# Patient Record
Sex: Female | Born: 1970 | Race: White | Hispanic: No | Smoking: Former smoker
Health system: Southern US, Community
[De-identification: ages and names within clinical notes are randomized; demographics above are authoritative.]

## PROBLEM LIST (undated history)

## (undated) DIAGNOSIS — R87619 Unspecified abnormal cytological findings in specimens from cervix uteri: Secondary | ICD-10-CM

## (undated) DIAGNOSIS — J189 Pneumonia, unspecified organism: Secondary | ICD-10-CM

## (undated) DIAGNOSIS — J40 Bronchitis, not specified as acute or chronic: Secondary | ICD-10-CM

## (undated) DIAGNOSIS — M1990 Unspecified osteoarthritis, unspecified site: Secondary | ICD-10-CM

## (undated) DIAGNOSIS — N200 Calculus of kidney: Secondary | ICD-10-CM

## (undated) DIAGNOSIS — Z9884 Bariatric surgery status: Secondary | ICD-10-CM

## (undated) DIAGNOSIS — M542 Cervicalgia: Secondary | ICD-10-CM

## (undated) DIAGNOSIS — G8929 Other chronic pain: Secondary | ICD-10-CM

## (undated) DIAGNOSIS — Z86718 Personal history of other venous thrombosis and embolism: Secondary | ICD-10-CM

## (undated) DIAGNOSIS — F329 Major depressive disorder, single episode, unspecified: Secondary | ICD-10-CM

## (undated) DIAGNOSIS — F32A Depression, unspecified: Secondary | ICD-10-CM

## (undated) DIAGNOSIS — F17200 Nicotine dependence, unspecified, uncomplicated: Secondary | ICD-10-CM

## (undated) DIAGNOSIS — D509 Iron deficiency anemia, unspecified: Secondary | ICD-10-CM

## (undated) DIAGNOSIS — F341 Dysthymic disorder: Secondary | ICD-10-CM

## (undated) DIAGNOSIS — M5412 Radiculopathy, cervical region: Secondary | ICD-10-CM

## (undated) DIAGNOSIS — K8020 Calculus of gallbladder without cholecystitis without obstruction: Secondary | ICD-10-CM

## (undated) DIAGNOSIS — M479 Spondylosis, unspecified: Secondary | ICD-10-CM

## (undated) DIAGNOSIS — Z114 Encounter for screening for human immunodeficiency virus [HIV]: Secondary | ICD-10-CM

## (undated) DIAGNOSIS — Z87898 Personal history of other specified conditions: Secondary | ICD-10-CM

## (undated) HISTORY — DX: Bronchitis, not specified as acute or chronic: J40

## (undated) HISTORY — DX: Depression, unspecified: F32.A

## (undated) HISTORY — DX: Dysthymic disorder: F34.1

## (undated) HISTORY — DX: Other chronic pain: G89.29

## (undated) HISTORY — DX: Pneumonia, unspecified organism: J18.9

## (undated) HISTORY — DX: Personal history of other venous thrombosis and embolism: Z86.718

## (undated) HISTORY — PX: OTHER SURGICAL HISTORY: SHX169

## (undated) HISTORY — DX: Encounter for screening for human immunodeficiency virus (HIV): Z11.4

## (undated) HISTORY — DX: Cervicalgia: M54.2

## (undated) HISTORY — DX: Nicotine dependence, unspecified, uncomplicated: F17.200

## (undated) HISTORY — DX: Personal history of other specified conditions: Z87.898

## (undated) HISTORY — DX: Unspecified abnormal cytological findings in specimens from cervix uteri: R87.619

## (undated) HISTORY — DX: Major depressive disorder, single episode, unspecified: F32.9

## (undated) HISTORY — DX: Unspecified osteoarthritis, unspecified site: M19.90

## (undated) HISTORY — DX: Bariatric surgery status: Z98.84

## (undated) HISTORY — DX: Radiculopathy, cervical region: M54.12

## (undated) HISTORY — DX: Iron deficiency anemia, unspecified: D50.9

## (undated) HISTORY — DX: Calculus of kidney: N20.0

## (undated) HISTORY — DX: Spondylosis, unspecified: M47.9

## (undated) HISTORY — DX: Calculus of gallbladder without cholecystitis without obstruction: K80.20

---

## 1998-08-10 ENCOUNTER — Emergency Department (HOSPITAL_COMMUNITY): Admission: EM | Admit: 1998-08-10 | Discharge: 1998-08-10 | Payer: Self-pay | Admitting: Emergency Medicine

## 1998-08-10 ENCOUNTER — Encounter: Payer: Self-pay | Admitting: Emergency Medicine

## 2004-06-10 HISTORY — PX: GASTRIC BYPASS: SHX52

## 2005-03-07 ENCOUNTER — Emergency Department (HOSPITAL_COMMUNITY): Admission: EM | Admit: 2005-03-07 | Discharge: 2005-03-07 | Payer: Self-pay | Admitting: Emergency Medicine

## 2005-06-10 ENCOUNTER — Encounter: Payer: Self-pay | Admitting: Family Medicine

## 2005-06-10 LAB — CONVERTED CEMR LAB: Pap Smear: NORMAL

## 2005-07-08 ENCOUNTER — Other Ambulatory Visit: Admission: RE | Admit: 2005-07-08 | Discharge: 2005-07-08 | Payer: Self-pay | Admitting: Obstetrics and Gynecology

## 2005-10-17 ENCOUNTER — Encounter: Payer: Self-pay | Admitting: Family Medicine

## 2007-01-07 ENCOUNTER — Emergency Department (HOSPITAL_COMMUNITY): Admission: EM | Admit: 2007-01-07 | Discharge: 2007-01-08 | Payer: Self-pay | Admitting: Emergency Medicine

## 2007-01-10 ENCOUNTER — Emergency Department (HOSPITAL_COMMUNITY): Admission: EM | Admit: 2007-01-10 | Discharge: 2007-01-10 | Payer: Self-pay | Admitting: Emergency Medicine

## 2007-05-09 ENCOUNTER — Emergency Department (HOSPITAL_COMMUNITY): Admission: EM | Admit: 2007-05-09 | Discharge: 2007-05-09 | Payer: Self-pay | Admitting: Emergency Medicine

## 2007-06-11 DIAGNOSIS — J189 Pneumonia, unspecified organism: Secondary | ICD-10-CM

## 2007-06-11 HISTORY — DX: Pneumonia, unspecified organism: J18.9

## 2007-07-07 ENCOUNTER — Emergency Department (HOSPITAL_COMMUNITY): Admission: EM | Admit: 2007-07-07 | Discharge: 2007-07-07 | Payer: Self-pay | Admitting: Family Medicine

## 2007-08-02 ENCOUNTER — Emergency Department (HOSPITAL_COMMUNITY): Admission: EM | Admit: 2007-08-02 | Discharge: 2007-08-02 | Payer: Self-pay | Admitting: Family Medicine

## 2008-02-16 ENCOUNTER — Ambulatory Visit: Payer: Self-pay | Admitting: Family Medicine

## 2008-02-16 DIAGNOSIS — Z9884 Bariatric surgery status: Secondary | ICD-10-CM

## 2008-02-16 DIAGNOSIS — M479 Spondylosis, unspecified: Secondary | ICD-10-CM | POA: Insufficient documentation

## 2008-02-16 DIAGNOSIS — F329 Major depressive disorder, single episode, unspecified: Secondary | ICD-10-CM | POA: Insufficient documentation

## 2008-02-16 HISTORY — DX: Bariatric surgery status: Z98.84

## 2008-02-16 HISTORY — DX: Spondylosis, unspecified: M47.9

## 2008-03-08 ENCOUNTER — Encounter: Payer: Self-pay | Admitting: Family Medicine

## 2008-03-15 ENCOUNTER — Encounter: Payer: Self-pay | Admitting: Family Medicine

## 2008-03-15 ENCOUNTER — Other Ambulatory Visit: Admission: RE | Admit: 2008-03-15 | Discharge: 2008-03-15 | Payer: Self-pay | Admitting: Family Medicine

## 2008-03-15 ENCOUNTER — Ambulatory Visit: Payer: Self-pay | Admitting: Family Medicine

## 2008-03-15 DIAGNOSIS — Z87898 Personal history of other specified conditions: Secondary | ICD-10-CM

## 2008-03-15 HISTORY — DX: Personal history of other specified conditions: Z87.898

## 2008-03-15 LAB — CONVERTED CEMR LAB
ALT: 13 units/L (ref 0–35)
AST: 17 units/L (ref 0–37)
Albumin: 4.2 g/dL (ref 3.5–5.2)
Alkaline Phosphatase: 102 units/L (ref 39–117)
BUN: 8 mg/dL (ref 6–23)
Basophils Absolute: 0 10*3/uL (ref 0.0–0.1)
Basophils Relative: 0 % (ref 0–1)
Bilirubin Urine: NEGATIVE
Bilirubin, Direct: 0.1 mg/dL (ref 0.0–0.3)
CO2: 21 meq/L (ref 19–32)
Calcium: 9.3 mg/dL (ref 8.4–10.5)
Chloride: 107 meq/L (ref 96–112)
Cholesterol: 189 mg/dL (ref 0–200)
Creatinine, Ser: 0.58 mg/dL (ref 0.40–1.20)
Eosinophils Absolute: 0.3 10*3/uL (ref 0.0–0.7)
Eosinophils Relative: 2 % (ref 0–5)
Glucose, Bld: 87 mg/dL (ref 70–99)
HCT: 39.4 % (ref 36.0–46.0)
HDL: 44 mg/dL (ref >39)
Hemoglobin, Urine: NEGATIVE
Hemoglobin: 12.5 g/dL (ref 12.0–15.0)
Indirect Bilirubin: 0.5 mg/dL (ref 0.0–0.9)
Ketones, ur: NEGATIVE mg/dL
LDL Cholesterol: 129 mg/dL — ABNORMAL HIGH (ref 0–99)
Lymphocytes Relative: 23 % (ref 12–46)
Lymphs Abs: 2.6 10*3/uL (ref 0.7–4.0)
MCHC: 31.7 g/dL (ref 30.0–36.0)
MCV: 59.9 fL — ABNORMAL LOW (ref 78.0–100.0)
Monocytes Absolute: 0.7 10*3/uL (ref 0.1–1.0)
Monocytes Relative: 7 % (ref 3–12)
Neutro Abs: 7.5 10*3/uL (ref 1.7–7.7)
Neutrophils Relative %: 68 % (ref 43–77)
Nitrite: NEGATIVE
Platelets: 271 10*3/uL (ref 150–400)
Potassium: 4.9 meq/L (ref 3.5–5.3)
Protein, ur: NEGATIVE mg/dL
RBC / HPF: NONE SEEN (ref <3)
RBC: 6.58 M/uL — ABNORMAL HIGH (ref 3.87–5.11)
RDW: 15.6 % — ABNORMAL HIGH (ref 11.5–15.5)
Sodium: 138 meq/L (ref 135–145)
Specific Gravity, Urine: 1.018 (ref 1.005–1.03)
TSH: 2.248 microintl units/mL (ref 0.350–4.50)
Total Bilirubin: 0.6 mg/dL (ref 0.3–1.2)
Total CHOL/HDL Ratio: 4.3
Total Protein: 6.7 g/dL (ref 6.0–8.3)
Triglycerides: 82 mg/dL (ref <150)
Urine Glucose: NEGATIVE mg/dL
Urobilinogen, UA: 1 (ref 0.0–1.0)
VLDL: 16 mg/dL (ref 0–40)
Vit D, 1,25-Dihydroxy: 17 — ABNORMAL LOW (ref 30–89)
WBC: 11.2 10*3/uL — ABNORMAL HIGH (ref 4.0–10.5)
pH: 7.5 (ref 5.0–8.0)

## 2008-03-17 ENCOUNTER — Encounter: Payer: Self-pay | Admitting: Family Medicine

## 2008-04-21 ENCOUNTER — Telehealth: Payer: Self-pay | Admitting: Family Medicine

## 2008-04-22 ENCOUNTER — Ambulatory Visit: Payer: Self-pay | Admitting: Family Medicine

## 2008-04-23 ENCOUNTER — Telehealth: Payer: Self-pay | Admitting: Family Medicine

## 2008-05-31 ENCOUNTER — Ambulatory Visit: Payer: Self-pay | Admitting: Family Medicine

## 2008-05-31 DIAGNOSIS — F341 Dysthymic disorder: Secondary | ICD-10-CM

## 2008-05-31 HISTORY — DX: Dysthymic disorder: F34.1

## 2008-07-19 ENCOUNTER — Ambulatory Visit: Payer: Self-pay | Admitting: Family Medicine

## 2008-07-20 ENCOUNTER — Telehealth: Payer: Self-pay | Admitting: Family Medicine

## 2008-08-23 ENCOUNTER — Ambulatory Visit: Payer: Self-pay | Admitting: Family Medicine

## 2008-08-30 ENCOUNTER — Telehealth: Payer: Self-pay | Admitting: Family Medicine

## 2008-09-15 ENCOUNTER — Telehealth: Payer: Self-pay | Admitting: Family Medicine

## 2008-09-27 ENCOUNTER — Ambulatory Visit: Payer: Self-pay | Admitting: Family Medicine

## 2008-09-27 DIAGNOSIS — G4700 Insomnia, unspecified: Secondary | ICD-10-CM

## 2008-10-25 ENCOUNTER — Ambulatory Visit: Payer: Self-pay | Admitting: Family Medicine

## 2008-10-25 DIAGNOSIS — M5412 Radiculopathy, cervical region: Secondary | ICD-10-CM | POA: Insufficient documentation

## 2008-10-25 HISTORY — DX: Radiculopathy, cervical region: M54.12

## 2008-11-10 ENCOUNTER — Telehealth: Payer: Self-pay | Admitting: Family Medicine

## 2008-11-13 ENCOUNTER — Emergency Department (HOSPITAL_COMMUNITY): Admission: EM | Admit: 2008-11-13 | Discharge: 2008-11-13 | Payer: Self-pay | Admitting: Emergency Medicine

## 2008-11-16 ENCOUNTER — Ambulatory Visit: Payer: Self-pay | Admitting: Family Medicine

## 2008-11-27 IMAGING — CR DG KNEE COMPLETE 4+V*L*
4 series · 4 of 4 positions shown · non-contrast
Comparison: None.

CLINICAL DATA: 36 year old with left knee pain.
 LEFT KNEE ? 4 VIEW ? 08/02/07:

[view not recorded (1 of 4)]
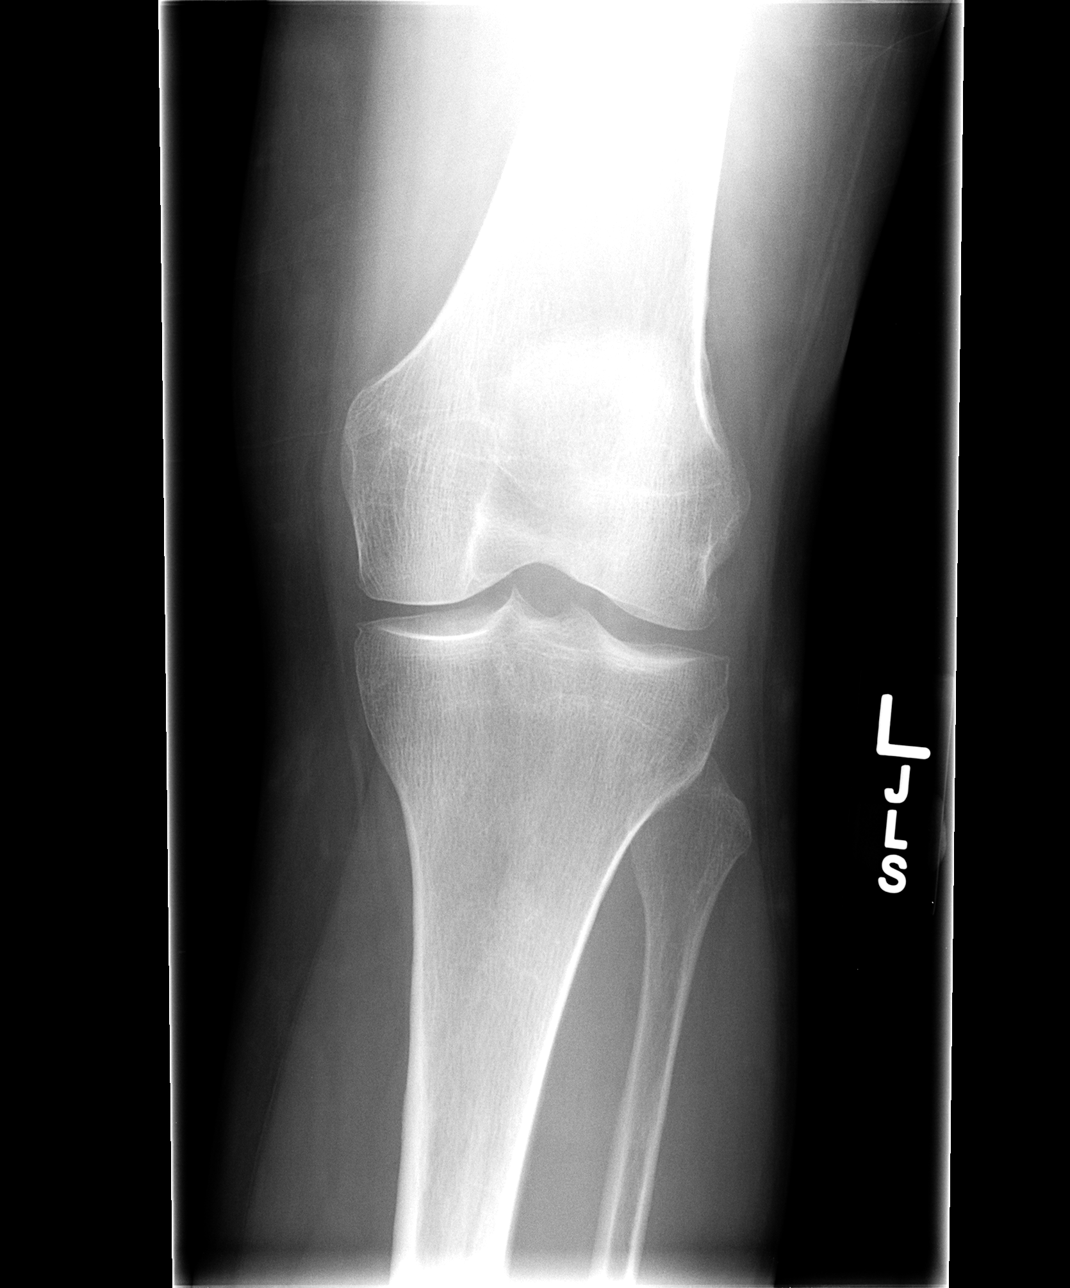

[view not recorded (2 of 4)]
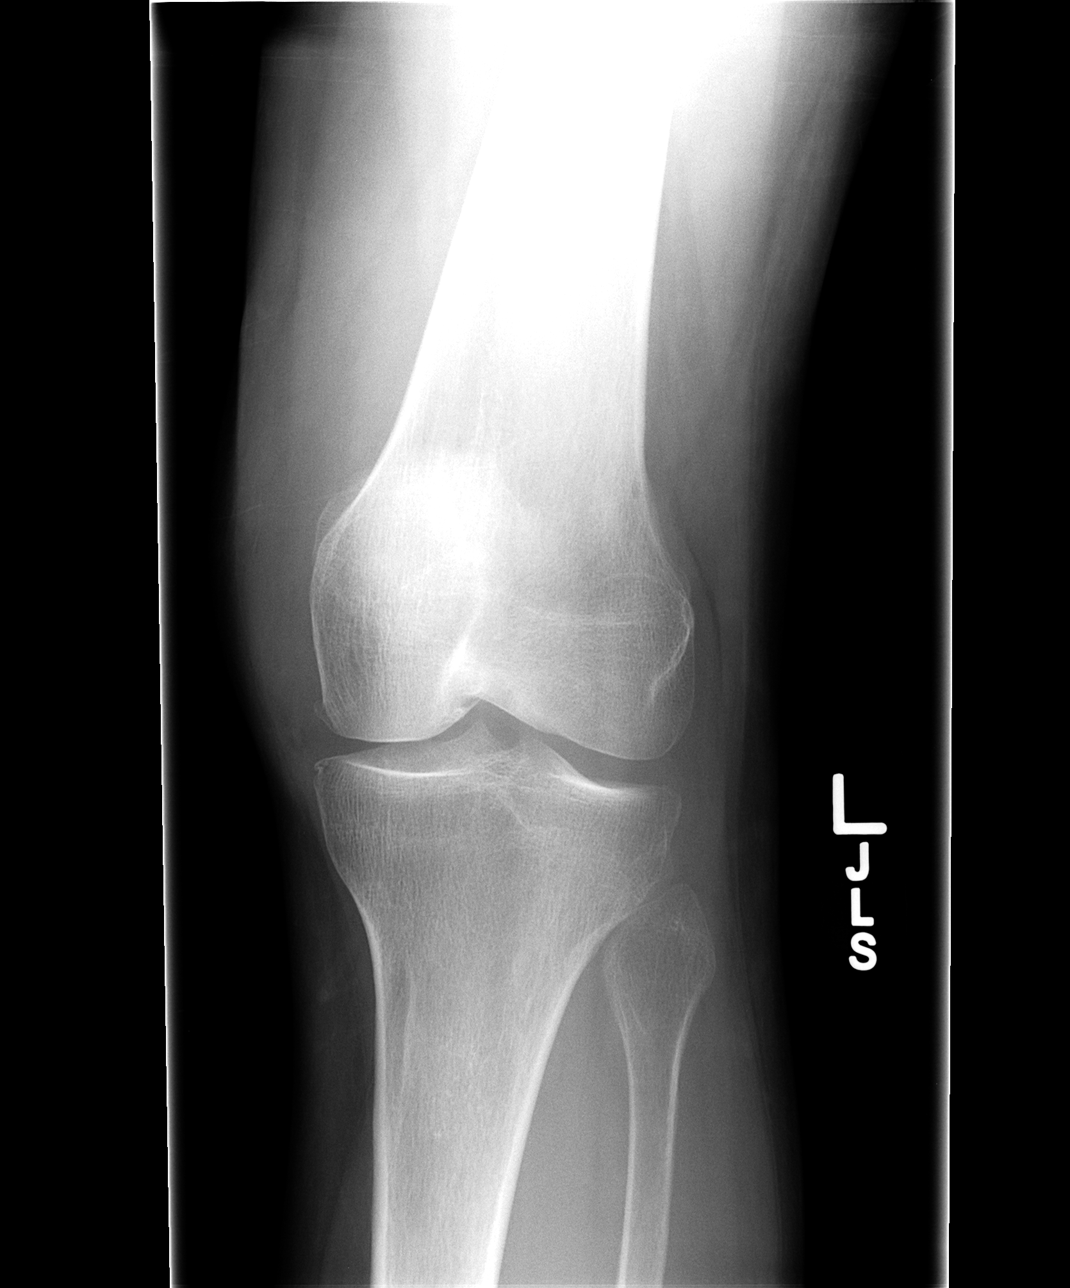

[view not recorded (3 of 4)]
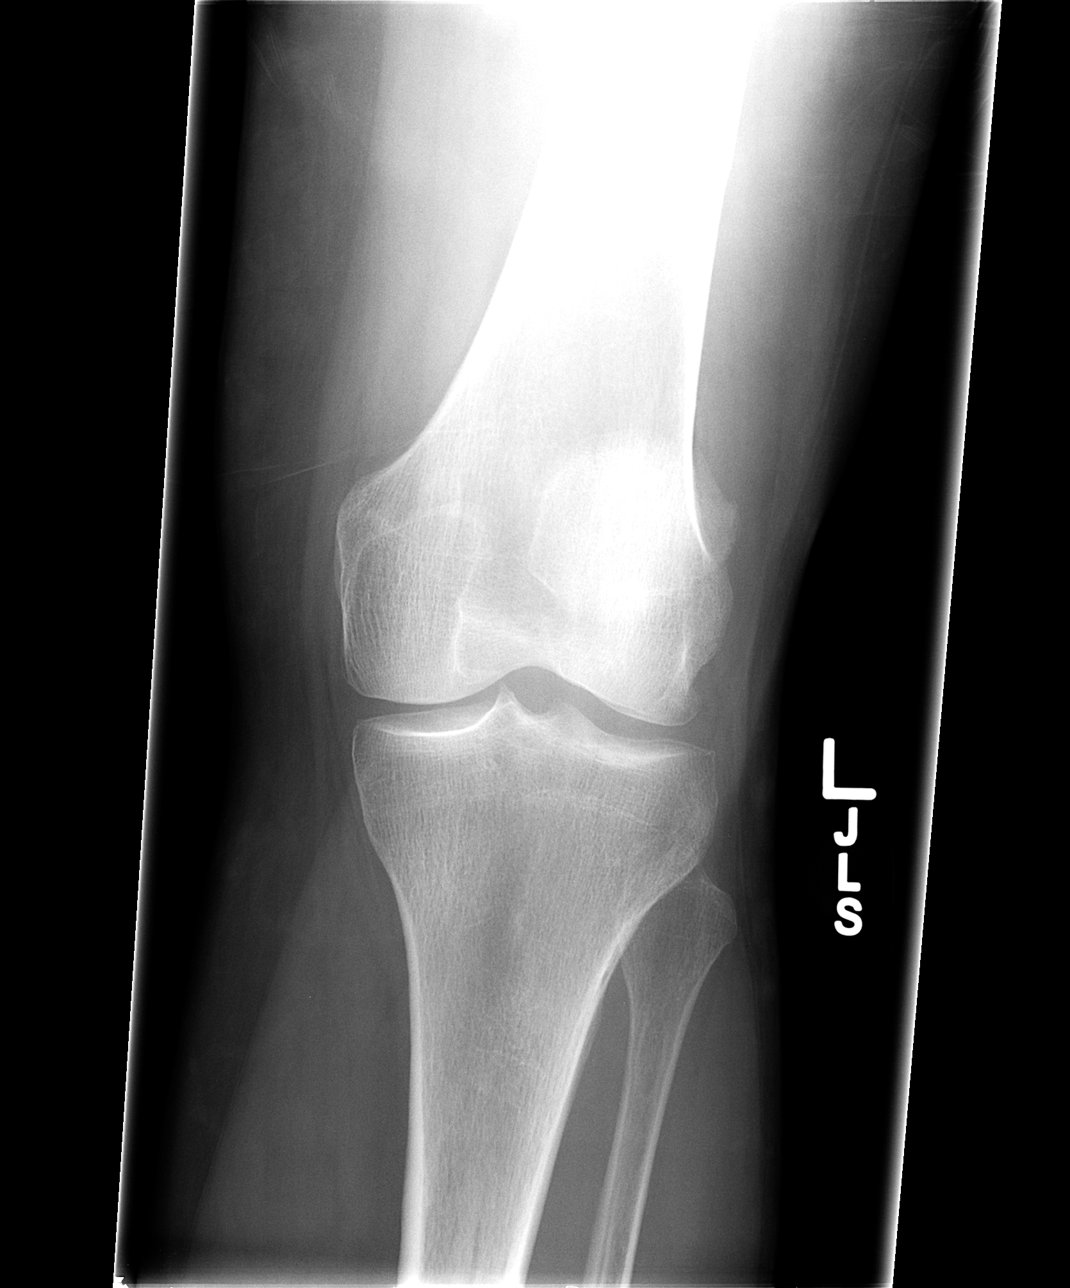

[view not recorded (4 of 4)]
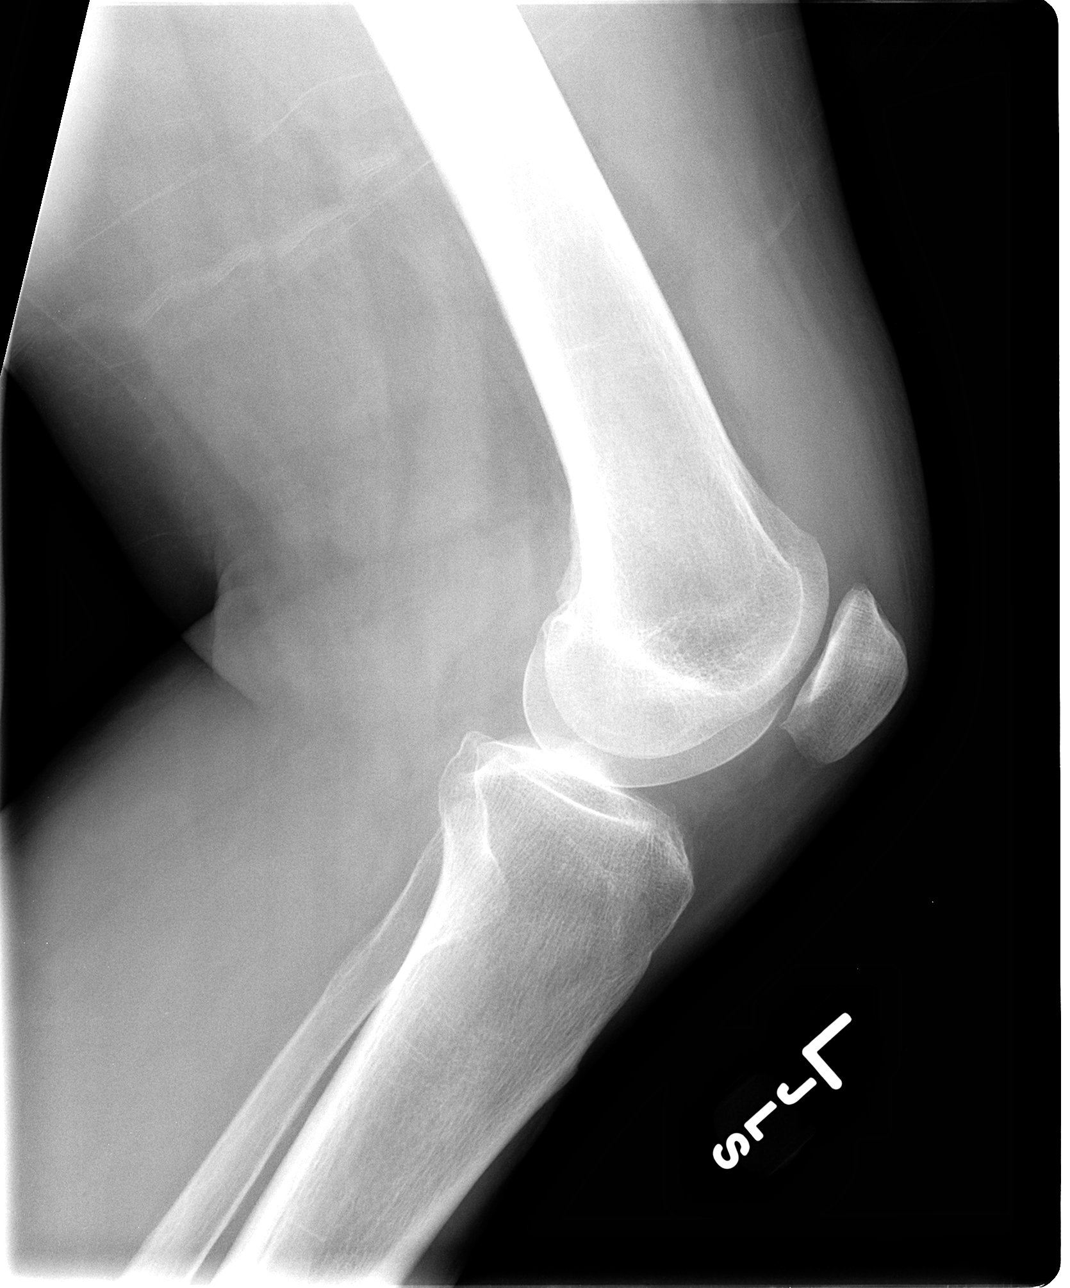

[4 of 4 positions shown; findings below may reference images not displayed]

FINDINGS: There are mild degenerative changes but no acute bony findings.  There is a moderate sized joint effusion.
IMPRESSION: 1.  Minimal degenerative changes.  No acute bony findings.
 2.  Moderate sized joint effusion.

## 2008-11-30 ENCOUNTER — Telehealth: Payer: Self-pay | Admitting: Family Medicine

## 2008-12-08 ENCOUNTER — Ambulatory Visit: Payer: Self-pay | Admitting: Family Medicine

## 2009-01-03 ENCOUNTER — Encounter: Payer: Self-pay | Admitting: Family Medicine

## 2009-02-08 ENCOUNTER — Ambulatory Visit: Payer: Self-pay | Admitting: Family Medicine

## 2009-02-08 DIAGNOSIS — M542 Cervicalgia: Secondary | ICD-10-CM

## 2009-02-08 DIAGNOSIS — M940 Chondrocostal junction syndrome [Tietze]: Secondary | ICD-10-CM

## 2009-02-08 HISTORY — DX: Cervicalgia: M54.2

## 2009-06-21 ENCOUNTER — Ambulatory Visit: Payer: Self-pay | Admitting: Family Medicine

## 2009-10-03 ENCOUNTER — Telehealth: Payer: Self-pay | Admitting: Family Medicine

## 2009-10-24 ENCOUNTER — Encounter: Payer: Self-pay | Admitting: Family Medicine

## 2010-01-30 ENCOUNTER — Ambulatory Visit: Payer: Self-pay | Admitting: Family Medicine

## 2010-01-30 DIAGNOSIS — F17200 Nicotine dependence, unspecified, uncomplicated: Secondary | ICD-10-CM

## 2010-01-30 HISTORY — DX: Nicotine dependence, unspecified, uncomplicated: F17.200

## 2010-02-20 ENCOUNTER — Encounter: Payer: Self-pay | Admitting: Family Medicine

## 2010-05-22 ENCOUNTER — Ambulatory Visit: Payer: Self-pay | Admitting: Family Medicine

## 2010-05-22 DIAGNOSIS — R87619 Unspecified abnormal cytological findings in specimens from cervix uteri: Secondary | ICD-10-CM

## 2010-05-22 DIAGNOSIS — N920 Excessive and frequent menstruation with regular cycle: Secondary | ICD-10-CM | POA: Insufficient documentation

## 2010-05-22 HISTORY — DX: Unspecified abnormal cytological findings in specimens from cervix uteri: R87.619

## 2010-06-05 ENCOUNTER — Ambulatory Visit: Payer: Self-pay | Admitting: Family Medicine

## 2010-06-05 ENCOUNTER — Other Ambulatory Visit
Admission: RE | Admit: 2010-06-05 | Discharge: 2010-06-05 | Payer: Self-pay | Source: Home / Self Care | Admitting: Family Medicine

## 2010-06-05 DIAGNOSIS — N760 Acute vaginitis: Secondary | ICD-10-CM | POA: Insufficient documentation

## 2010-07-03 ENCOUNTER — Telehealth: Payer: Self-pay | Admitting: Family Medicine

## 2010-07-10 NOTE — Progress Notes (Signed)
Summary: Pt has moved to [place]. Needs referral.  Phone Note Call from Patient Call back at [phone] cell   Caller: Patient Summary of Call: Pt called and said that she has moved to [place], [place]. Pt has found a doctor there, but she needs a referral in order to make an ov. Please call [facility]  [phone], and they will send records req and referral form.        Initial call taken by: Lucy Antigua,  October 03, 2009 8:25 AM  Follow-up for Phone Call        pt called again today really wants referral  Follow-up by: Pura Spice, RN,  Oct 17, 2009 10:25 AM  Additional Follow-up for Phone Call Additional follow up Details #1::        left mess for pt to return call  Additional Follow-up by: Pura Spice, RN,  Oct 17, 2009 1:12 PM    Additional Follow-up for Phone Call Additional follow up Details #2::    ok per dr Scotty Court to call and sch appt [facility]  Follow-up by: Pura Spice, RN,  Oct 17, 2009 2:23 PM
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
## 2010-07-10 NOTE — Letter (Signed)
Summary: [facility]  [facility]   Imported By: Maryln Gottron 11/17/2009 13:29:09  _____________________________________________________________________  External Attachment:    Type:   Image     Comment:   External Document  Appended Document: [facility] reviewed denial to pain clinic

## 2010-07-10 NOTE — Assessment & Plan Note (Signed)
Summary: pts had a cold for 3 weeks/cjr   Vital Signs:  Patient profile:   40 year old female Weight:      159 pounds BMI:     26.55 O2 Sat:      98 % Temp:     98.1 degrees F Pulse rate:   112 / minute BP sitting:   110 / 80  (left arm)  Vitals Entered By: Pura Spice, RN (June 21, 2009 10:58 AM) CC: chest congestion. still living in [place]  Is Patient Diabetic? No Pain Assessment Patient in pain? no        History of Present Illness: This 40 year old white female who has had gastric bypass is in today complaining of chest congestion, nasal congestion general malaise and fever. She also relates she has lost weight which amount to 15 pounds. 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 She has more coughing in your week morning and she is productive of a yellow-green sputum Nasal congestion with drainage of yellow or drainage, has been on Mucinex D She continues to have her problem was recurrent fever we'll discharge or herpes simplex needs her Valtrex refill Due to her chronic neck and back pain she continues to take nabumetone.She continues to need her hydrocodone for the pain She has discontinued the Effexor and had quite a time withdrawing   Allergies (verified): No Known Drug Allergies  Past History:  Past Medical History: Last updated: 02/16/2008 chicken pox Depression testing for HIV Urinary track infections  Past Surgical History: Last updated: 02/16/2008 Caesarean section x 2  Gastric bypass   2007  dislocated rt foot with 2 surgeries 85 and 1988  Risk Factors: Smoking Status: current (02/16/2008) Packs/Day: 1 (02/16/2008)  Review of Systems  The patient denies anorexia, fever, weight loss, weight gain, vision loss, decreased hearing, hoarseness, chest pain, syncope,  dyspnea on exertion, peripheral edema, prolonged cough, headaches, hemoptysis, abdominal pain, melena, hematochezia, severe indigestion/heartburn, hematuria, incontinence, genital sores, muscle weakness, suspicious skin lesions, transient blindness, difficulty walking, depression, unusual weight change, abnormal bleeding, enlarged lymph nodes, angioedema, breast masses, and testicular masses.    Physical Exam  General:  Well-developed,well-nourished,in no acute distress; alert,appropriate and cooperative throughout examinationoverweight-appearing.   Head:  Normocephalic and atraumatic without obvious abnormalities. No apparent alopecia or balding. Eyes:  No corneal or conjunctival inflammation noted. EOMI. Perrla. Funduscopic exam benign, without hemorrhages, exudates or papilledema. Vision grossly normal. Ears:  External ear exam shows no significant lesions or deformities.  Otoscopic examination reveals clear canals, tympanic membranes are intact bilaterally without bulging, retraction, inflammation or discharge. Hearing is grossly normal bilaterally. Nose:  nasal mucosa is swollen erythematous with yellow drainage Mouth:  Oral mucosa and oropharynx without lesions or exudates.  Teeth in good repair. Neck:  tenderness over the cervical spine Chest Wall:  No deformities, masses, or tenderness noted. Lungs:  rhonchi bilaterally rales bilaterally no dullness slight expiratory wheezes on deep inspiration and x-rays Heart:  Normal rate and regular rhythm. S1 and S2 normal without gallop, murmur, click, rub or other extra sounds. Abdomen:  Bowel sounds positive,abdomen soft and non-tender without masses, organomegaly or hernias noted. Extremities:  No clubbing, cyanosis, edema, or deformity noted with normal full range of motion of all joints.     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 1 once daily for allergy Her updated medication list for this problem includes:    Nabumetone 750 Mg Tabs (Nabumetone) .Marland Kitchen... 1 two times a day  after meals for chest and neck    Mucinex D [id number] Mg Xr12h-tab (Pseudoephedrine-guaifenesin) ..... One b.i.d.  Problem # 2:  ASTHMATIC BRONCHITIS, ACUTE (ICD-466.0) Assessment: Deteriorated  The following medications were removed from the medication list:    Doxycycline Monohydrate 100 Mg Caps (Doxycycline monohydrate) .Marland Kitchen... 1 two times a day for infection Her updated medication list for this problem includes:    Doxycycline Hyclate 100 Mg Tabs (Doxycycline hyclate) .Marland Kitchen... 1 two times a day for infectipon    Mucinex D [id number] Mg Xr12h-tab (Pseudoephedrine-guaifenesin) ..... One b.i.d.  Orders: Prescription Created Electronically [phone])  Problem # 3:  CERVICAL RADICULOPATHY (ICD-723.4) Assessment: Unchanged  Problem # 4:  GENITAL HERPES, HX OF (ICD-V13.8) Assessment: Unchanged  Complete Medication List: 1)  Valtrex 500 Mg Tabs (Valacyclovir hcl) .Marland Kitchen.. 1  tid 2)  Soma 350 Mg Tabs (Carisoprodol) .Marland Kitchen.. 1 three times a day for muscle spasm 3)  Nabumetone 750 Mg Tabs (Nabumetone) .Marland Kitchen.. 1 two times a day  after meals for chest and neck 4)  Hydrocodone-acetaminophen 10-325 Mg Tabs (Hydrocodone-acetaminophen) .Marland Kitchen.. 1 q4h as needed for pain 5)  Doxycycline Hyclate 100 Mg Tabs (Doxycycline hyclate) .Marland Kitchen.. 1 two times a day for infectipon 6)  Mucinex D [phone] Mg Xr12h-tab (Pseudoephedrine-guaifenesin) .... One b.i.d.  Patient Instructions: 1)  her problems are asthmatic bronchitis as well as bacterial rhinitis 2)  type medications as prescribed refill of her hydrocodone call if things don't go well Prescriptions: SOMA 350 MG TABS (CARISOPRODOL) 1 three times a day for muscle spasm  #90 x 5   Entered and Authorized by:   Judithann Sheen MD   Signed by:   Judithann Sheen MD on  06/21/2009   Method used:   Print then Give to Patient   RxID:   [id number] HYDROCODONE-ACETAMINOPHEN 10-325 MG TABS (HYDROCODONE-ACETAMINOPHEN) 1 q4h as needed for pain  #120 x 5   Entered and Authorized by:   Judithann Sheen MD   Signed by:   Judithann Sheen MD on 06/21/2009   Method used:   Print then Give to Patient   RxID:   [id number] DOXYCYCLINE HYCLATE 100 MG TABS (DOXYCYCLINE HYCLATE) 1 two times a day for infectipon  #20 x 0   Entered and Authorized by:   Judithann Sheen MD   Signed by:   Judithann Sheen MD on 06/21/2009   Method used:   Print then Give to Patient   RxID:   [id number] HYDROCODONE-ACETAMINOPHEN 10-325 MG TABS (HYDROCODONE-ACETAMINOPHEN) 1 q4h as needed for pain  #100 x 5   Entered and Authorized by:   Judithann Sheen MD   Signed by:   Judithann Sheen MD on 06/21/2009   Method used:   Print then Give to Patient   RxID:   [id number]

## 2010-07-12 NOTE — Assessment & Plan Note (Signed)
Summary: PAP ONLY//[facility]   Vital Signs:  Patient profile:   40 year old female Menstrual status:  regular LMP:     05/22/2010 CC: pap only LMP (date): 05/22/2010     Menstrual Status regular Enter LMP: 05/22/2010 Last PAP Result NEGATIVE FOR INTRAEPITHELIAL LESIONS OR MALIGNANCY.   History of Present Illness: This for 15-year-old who has a had a positive Pap previously with cryosurgery and a repeat Pap in September it: L. and was found to be negative and was due to have a repeat examination and is in today for a repeat Pap examination on  Allergies: No Known Drug Allergies  Past History:  Past Medical History: Last updated: 02/16/2008 chicken pox Depression testing for HIV Urinary track infections  Past Surgical History: Last updated: 02/16/2008 Caesarean section x 2  Gastric bypass   2007  dislocated rt foot with 2 surgeries 87 and 1988  Family History: Last updated: 02/16/2008 Family History of Arthritis Family History Uterine cancer breast cancer in grandparents  Family History Lung cancer Family History Hypertension Family History Kidney disease emotional mental illness in parents grandaparents and other blood relatives diabetesin parents  Risk Factors: Smoking Status: current (02/16/2008) Packs/Day: 1 (02/16/2008)  Review of Systems      See HPI  Physical Exam  General:  Well-developed,well-nourished,in no acute distress; alert,appropriate and cooperative throughout examinationoverweight-appearing.   Genitalia:  Pelvic Exam:        External: normal female genitalia without lesions or masses        Vagina: normal without lesions or masses        Cervix: normal without lesions or masses        Adnexa: normal bimanual exam without masses or fullness        Uterus: normal by palpation        Pap smear: performed   Complete Medication List: 1)  Valtrex 500 Mg Tabs (Valacyclovir hcl) .Marland Kitchen.. 1  tid 2)  Nabumetone 750 Mg Tabs (Nabumetone) .Marland Kitchen.. 1 two  times a day  after meals for chest and neck 3)  Hydrocodone-acetaminophen 10-325 Mg Tabs (Hydrocodone-acetaminophen) .Marland Kitchen.. 1 q4h as needed for pain, not over 4 per day,refill on schedule 4)  Venlafaxine Hcl 75 Mg Xr24h-cap (Venlafaxine hcl) .Marland Kitchen.. 1 qd 5)  Oxycodone Hcl 10 Mg Tabs (Oxycodone hcl) .Marland Kitchen.. 1 tab q4-6 h as needed for severe pain  Other Orders: Pap Smear, Thin Prep ( Collection of) (V4098)  Patient Instructions: 1)  we'll call results of Pap smear when returned   Orders Added: 1)  Est. Patient Level III [11914] 2)  Pap Smear, Thin Prep ( Collection of) [Q0091]

## 2010-07-12 NOTE — Progress Notes (Signed)
Summary: new rx  Phone Note Refill Request Message from:  Patient  Refills Requested: Medication #1:  OXYCODONE HCL 10 MG TABS 1 tab q4-6 h as needed for severe pain pt needs new rx call [phone] when ready  Initial call taken by: Heron Sabins,  July 03, 2010 9:35 AM  Follow-up for Phone Call        filled  tx

## 2010-07-12 NOTE — Assessment & Plan Note (Signed)
Summary: consult re: pap screening/cjr/PT RESCD FROM BUMP//CCM   Vital Signs:  Patient profile:   40 year old female LMP:     05/21/2010 Height:      65 inches Weight:      138 pounds BMI:     23.05 O2 Sat:      98 % on Room air Temp:     98.5 degrees F oral Pulse rate:   109 / minute Pulse rhythm:   regular BP sitting:   100 / 70  (left arm) Cuff size:   regular  Vitals Entered By: Romualdo Bolk, CMA (AAMA) (May 22, 2010 11:33 AM)  O2 Flow:  Room air CC: Pt here to discuss heavy periods and spotting in between periods. Pt states that her last pap was in Sept of this year done by a Cervical Clinic at [facility] but she never heard from the results. LMP (date): 05/21/2010     Enter LMP: 05/21/2010 Last PAP Result NEGATIVE FOR INTRAEPITHELIAL LESIONS OR MALIGNANCY.   History of Present Illness: This 40 year old white widowed is in today to discuss her menstrual problems. She has been having irregular bleeding and heavy flow at time of menses with an irregular cycle. She has had previous positive Pap smears but had a Pap smear done at the [facility] health clinic in September but has not received any correspondence pertaining to the Pap smear I have called and found to have fairly negative report will be sent to me Unable to examine the patient Today because of profuse vaginal bleeding Patient has had bariatric surgery and has lost proximally 50 pounds [place] relates that she needs a refill of her hydrocodone do to the pain in the cervical region she has had for some time   Preventive Screening-Counseling & Management  Alcohol-Tobacco     Smoking Status: current     Smoking Cessation Counseling: yes     Packs/Day: 1     Year Started: 1987  Current Medications (verified): 1)  Valtrex 500 Mg Tabs (Valacyclovir Hcl) .Marland Kitchen.. 1  Tid 2)  Nabumetone 750 Mg Tabs (Nabumetone) .Marland Kitchen.. 1 Two Times A Day  After Meals For Chest and Neck 3)  Hydrocodone-Acetaminophen 10-325 Mg Tabs  (Hydrocodone-Acetaminophen) .Marland Kitchen.. 1 Q4h As Needed For Pain, Not Over 4 Per Day  Allergies (verified): No Known Drug Allergies  Past History:  Past Medical History: Last updated: 02/16/2008 chicken pox Depression testing for HIV Urinary track infections  Past Surgical History: Last updated: 02/16/2008 Caesarean section x 2  Gastric bypass   2007  dislocated rt foot with 2 surgeries 87 and 1988  Family History: Last updated: 02/16/2008 Family History of Arthritis Family History Uterine cancer breast cancer in grandparents  Family History Lung cancer Family History Hypertension Family History Kidney disease emotional mental illness in parents grandaparents and other blood relatives diabetesin parents  Risk Factors: Smoking Status: current (05/22/2010) Packs/Day: 1 (05/22/2010)  Review of Systems      See HPI  The patient denies anorexia, fever, weight loss, weight gain, vision loss, decreased hearing, hoarseness, chest pain, syncope, dyspnea on exertion, peripheral edema, prolonged cough, headaches, hemoptysis, abdominal pain, melena, hematochezia, severe indigestion/heartburn, hematuria, incontinence, genital sores, muscle weakness, suspicious skin lesions, transient blindness, difficulty walking, depression, unusual weight change, abnormal bleeding, enlarged lymph nodes, angioedema, breast masses, and testicular masses.    Physical Exam  General:  Well-developed,well-nourished,in no acute distress; alert,appropriate and cooperative throughout examinationoverweight-appearing.   Head:  Normocephalic and atraumatic without obvious abnormalities. No  apparent alopecia or balding. Eyes:  No corneal or conjunctival inflammation noted. EOMI. Perrla. Funduscopic exam benign, without hemorrhages, exudates or papilledema. Vision grossly normal. Ears:  External ear exam shows no significant lesions or deformities.  Otoscopic examination reveals clear canals, tympanic membranes are  intact bilaterally without bulging, retraction, inflammation or discharge. Hearing is grossly normal bilaterally. Nose:  External nasal examination shows no deformity or inflammation. Nasal mucosa are pink and moist without lesions or exudates. Mouth:  Oral mucosa and oropharynx without lesions or exudates.  Teeth in good repair. Neck:  tenderness cervical spine 5 through 7 Chest Wall:  No deformities, masses, or tenderness noted. Breasts:  No mass, nodules, thickening, tenderness, bulging, retraction, inflamation, nipple discharge or skin changes noted.   Lungs:  Normal respiratory effort, chest expands symmetrically. Lungs are clear to auscultation, no crackles or wheezes. Heart:  Normal rate and regular rhythm. S1 and S2 normal without gallop, murmur, click, rub or other extra sounds.   Impression & Recommendations:  Problem # 1:  PAP SMEAR, ABNORMAL (ICD-795.00) Assessment New history of abnormal Pap smear in the past to be repeated  Problem # 2:  MENORRHAGIA (ICD-626.2) Assessment: New 2 at term for examination and treatment at that time the  Problem # 3:  NECK PAIN, CHRONIC (ICD-723.1) Assessment: Unchanged  The following medications were removed from the medication list:    Soma 350 Mg Tabs (Carisoprodol) .Marland Kitchen... 1 three times a day for muscle spasm Her updated medication list for this problem includes:    Nabumetone 750 Mg Tabs (Nabumetone) .Marland Kitchen... 1 two times a day  after meals for chest and neck    Hydrocodone-acetaminophen 10-325 Mg Tabs (Hydrocodone-acetaminophen) .Marland Kitchen... 1 q4h as needed for pain, not over 4 per day,refill on schedule    Oxycodone Hcl 10 Mg Tabs (Oxycodone hcl) .Marland Kitchen... 1 tab q4-6 h as needed for severe pain  Problem # 4:  ANXIETY DEPRESSION (ICD-300.4) Assessment: Unchanged  Problem # 5:  GENITAL HERPES, HX OF (ICD-V13.8) Assessment: Unchanged  Complete Medication List: 1)  Valtrex 500 Mg Tabs (Valacyclovir hcl) .Marland Kitchen.. 1  tid 2)  Nabumetone 750 Mg Tabs  (Nabumetone) .Marland Kitchen.. 1 two times a day  after meals for chest and neck 3)  Hydrocodone-acetaminophen 10-325 Mg Tabs (Hydrocodone-acetaminophen) .Marland Kitchen.. 1 q4h as needed for pain, not over 4 per day,refill on schedule 4)  Venlafaxine Hcl 75 Mg Xr24h-cap (Venlafaxine hcl) .Marland Kitchen.. 1 qd 5)  Oxycodone Hcl 10 Mg Tabs (Oxycodone hcl) .Marland Kitchen.. 1 tab q4-6 h as needed for severe pain 6)  Metronidazole 0.75 % Gel (Metronidazole) .... Insert 1 applicator hs for bacterial vaginitis  Patient Instructions: 1)  call: Larey Seat and found the Pap smear to be normal in September 2)  2 repeat after you have stopped the vaginal bleeding make another 3)  Appointment 4)  Refill medications Prescriptions: METRONIDAZOLE 0.75 % GEL (METRONIDAZOLE) Insert 1 applicator hs for bacterial vaginitis  #45 gms x 1   Entered and Authorized by:   Judithann Sheen MD   Signed by:   Judithann Sheen MD on 06/05/2010   Method used:   Electronically to        [facility]. [phone]* (retail)       [phone]       [place], [place]  [address]       Ph: [phone]       Fax: [phone]   RxID:   [id number]  OXYCODONE HCL 10 MG TABS (OXYCODONE HCL) 1 tab q4-6 h as needed for severe pain  #50 x 0   Entered and Authorized by:   Judithann Sheen MD   Signed by:   Judithann Sheen MD on 05/22/2010   Method used:   Print then Give to Patient   RxID:   [id number] HYDROCODONE-ACETAMINOPHEN 10-325 MG TABS (HYDROCODONE-ACETAMINOPHEN) 1 q4h as needed for pain, not over 4 per day,refill on schedule  #120 x 5   Entered and Authorized by:   Judithann Sheen MD   Signed by:   Judithann Sheen MD on 05/22/2010   Method used:   Print then Give to Patient   RxID:   [id number] VENLAFAXINE HCL 75 MG XR24H-CAP (VENLAFAXINE HCL) 1 qd  #30 x 11   Entered and Authorized by:   Judithann Sheen MD   Signed by:   Judithann Sheen MD on 05/22/2010   Method used:   Electronically  to        [facility]. [phone]* (retail)       [phone]       [place], [place]  [address]       Ph: [phone]       Fax: [phone]   RxID:   [id number]    Orders Added: 1)  Est. Patient Level III [01601]

## 2010-07-29 ENCOUNTER — Inpatient Hospital Stay (INDEPENDENT_AMBULATORY_CARE_PROVIDER_SITE_OTHER)
Admission: RE | Admit: 2010-07-29 | Discharge: 2010-07-29 | Disposition: A | Discharge status: Home / Self Care | Payer: Self-pay | Source: Ambulatory Visit | Attending: Emergency Medicine | Admitting: Emergency Medicine

## 2010-07-29 DIAGNOSIS — J45909 Unspecified asthma, uncomplicated: Secondary | ICD-10-CM

## 2010-08-07 ENCOUNTER — Telehealth: Payer: Self-pay | Admitting: Family Medicine

## 2010-08-07 MED ORDER — AMOXICILLIN 500 MG PO CAPS: 500.0000 mg | ORAL_CAPSULE | Freq: Three times a day (TID) | ORAL | Status: AC

## 2010-08-07 NOTE — Telephone Encounter (Signed)
Pt called in and adv she needs a refill on med: oxycodone..... Pt would like a return call to speak with Dr Scotty Court .Marland Kitchen... #  [phone].

## 2010-10-23 NOTE — H&P (Signed)
NAMEFARZANA, KOCI               ACCOUNT NO.:  [id number]   MEDICAL RECORD NO.:  [id number]          PATIENT TYPE:  EMS   LOCATION:  MAJO                         FACILITY:  [facility]   PHYSICIAN:  Hollice Espy, M.D.DATE OF BIRTH:  1970-09-22   DATE OF ADMISSION:  01/07/2007  DATE OF DISCHARGE:                              HISTORY & PHYSICAL   PRIMARY CARE PHYSICIAN:  Lupe Carney, M.D.   CHIEF COMPLAINT:  Weakness.   HISTORY OF PRESENT ILLNESS:  The patient is an 40 year old white female  with a past medical history of hypertension, who normally takes atenolol  and Cardizem for her blood pressure.  Today she says that she felt that  her blood pressure was high and she took an additional, normally takes a  half tablet of atenolol b.i.d. but this time took a whole tablet in  addition to her Cardizem.  She said that after that she was feeling  lightheaded and weak.  When she came into the emergency room she was  noted to have a blood pressure of 133/64 but her initial heart rate was  around 34.  The rest of her vital signs were unremarkable.   Chest x-ray and laboratory work were drawn, all of which were  unremarkable except for noting a BUN of 27 and a creatinine of 1.5.  The  rest of her laboratory studies were unremarkable.   The patient was put on IV fluids.  Her blood pressure has since then  started to titrate up to as high as a systolic in the 180s and her heart  rate has been right around 50.  She is currently doing well.  She says  that she feels, while not back to 100%, she feels her energy returning.  She denies any headaches, vision.  She was complaining of some earlier  weakness and eye pain which she says now has resolved.  She denies any  acute vision changes, no dysphagia, no chest pain, palpitations,  shortness of breath, wheeze, cough, abdominal pain, hematuria, dysuria,  constipation, diarrhea, focal extremity numbness or weakness or pain.  The patient's  review of systems is otherwise negative.   PAST MEDICAL HISTORY:  The patient's past medical history includes  hypertension.   MEDICATIONS:  She is on atenolol and Cardizem.   ALLERGIES:  She is allergic to morphine which she says causes her to be  confused.  She denies any tobacco, alcohol, or drug use.   FAMILY HISTORY:  The family history is noncontributory.   PHYSICAL EXAMINATION:  VITAL SIGNS:  On admission, temperature is  afebrile, heart rate initially 34 now is right at 50, respirations of  28, O2 saturation of 97% on room air, and blood pressure is 133/64 now,  as high as 188/78.  GENERAL:  The patient is alert and oriented times three, in no apparent  distress.  HEENT:  Normocephalic, atraumatic.  Her mucous membranes are moist.  She  has no carotid bruits.  HEART:  Regular rhythm, mild bradycardia, 2/6 systolic ejection murmur.  LUNGS:  The lungs are clear to auscultation bilaterally.  ABDOMEN:  The abdomen is soft, nontender, nondistended, positive bowel  sounds.  EXTREMITIES:  The extremities show no clubbing, cyanosis, or edema.   LABORATORY WORK:  The laboratory work showed a white count of 4.4, H and  H of 13.1 and 39.6, MCV of 92, and platelet count of 197, no shift.  Sodium of 137, potassium of 4.1, chloride of 104, bicarb of 29, BUN of  27, creatinine of 1.5, glucose of 116.  CPK of 169, MB of less than 1,  troponin I of less than 0.05.   ASSESSMENT AND PLAN:  1. Bradycardia:  This is likely secondary to taking too much      chronotropic antihypertensives.  Will hold both Cardizem and      atenolol and continue to place on 24 hour observation once her      heart rate is improved, will be able to discharge home.  2. Acute renal failure, likely secondary to decreased perfusion from      decreased blood pressure and heart rate, will gently hydrate, check      a BMET in the morning.  3. Hypertension:  She has already been shown to be hypertensive      despite  her bradycardia.  Will treat with non-chronotropic agents      such as Imdur.      Hollice Espy, M.D.  Electronically Signed     SKK/MEDQ  D:  01/07/2007  T:  01/08/2007  Job:  [id number]   cc:   Hollice Espy, M.D.  Barnetta Chapel, MD  L. Lupe Carney, M.D.

## 2010-10-24 ENCOUNTER — Ambulatory Visit (INDEPENDENT_AMBULATORY_CARE_PROVIDER_SITE_OTHER): Payer: Self-pay | Admitting: Family Medicine

## 2010-10-24 ENCOUNTER — Encounter: Payer: Self-pay | Admitting: Family Medicine

## 2010-10-24 DIAGNOSIS — M47812 Spondylosis without myelopathy or radiculopathy, cervical region: Secondary | ICD-10-CM

## 2010-10-24 DIAGNOSIS — M62838 Other muscle spasm: Secondary | ICD-10-CM

## 2010-10-24 DIAGNOSIS — Z8619 Personal history of other infectious and parasitic diseases: Secondary | ICD-10-CM

## 2010-10-24 MED ORDER — CYCLOBENZAPRINE HCL 10 MG PO TABS: ORAL_TABLET | ORAL | Status: DC

## 2010-10-24 MED ORDER — VALACYCLOVIR HCL 500 MG PO TABS: 500.0000 mg | ORAL_TABLET | Freq: Every day | ORAL | Status: DC

## 2010-10-24 MED ORDER — HYDROCODONE-ACETAMINOPHEN 10-325 MG PO TABS: 1.0000 | ORAL_TABLET | ORAL | Status: DC | PRN

## 2010-10-24 NOTE — Patient Instructions (Signed)
Will refill medications  of hydrocodone and Valtrex A new prescription for Flexeril 10 mg 3 times daily Return as needed

## 2010-10-24 NOTE — Progress Notes (Signed)
  Subjective:    Patient ID: Sharon Ortega, female    DOB: 11/05/1970, 40 y.o.   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, theatre/television/film her complaints is that she continues to have pain over the neck was radiation of pain from the neck into the left shoulder also muscle spasm over the upper thoracic spine as well as some of the lumbar spine she relates that the herpes genitalia had been prevalent but mild and would like to continue Valtrex\Complains of some pain of the right knee but not severeHPI    Review of Systems patient is otherwise doing fine she has had bariatric surgery and is maintaining her weight her well she has no other complaints other than being under considerable stress     Objective:   Physical Exam the patient is well-developed well-nourished white attractive female who is in no distress   Tenderness over the cervical spine see 2 through 4 muscle spasm of the upper thoracic spine pelvic examination not done      Assessment & Plan:  Herpes genitalia treated with Valtrex 500 mg q.d. Preventive Chronic cervical pain treated with Flexeril and hydrocodone NSAIDs cause GI distress

## 2010-11-08 ENCOUNTER — Telehealth: Payer: Self-pay | Admitting: Family Medicine

## 2010-11-08 ENCOUNTER — Other Ambulatory Visit: Payer: Self-pay | Admitting: Family Medicine

## 2010-11-08 MED ORDER — OXYCODONE-ACETAMINOPHEN 10-325 MG PO TABS: ORAL_TABLET | ORAL | Status: DC

## 2010-11-08 NOTE — Telephone Encounter (Signed)
Pt called req refill of Oxycodone 10-325 mg.

## 2010-11-13 NOTE — Telephone Encounter (Signed)
Done

## 2010-12-27 ENCOUNTER — Emergency Department (HOSPITAL_COMMUNITY): Payer: Self-pay

## 2010-12-27 ENCOUNTER — Emergency Department (HOSPITAL_COMMUNITY)
Admission: EM | Admit: 2010-12-27 | Discharge: 2010-12-27 | Disposition: A | Discharge status: Home / Self Care | Payer: Self-pay | Attending: Emergency Medicine | Admitting: Emergency Medicine

## 2010-12-27 ENCOUNTER — Telehealth: Payer: Self-pay

## 2010-12-27 DIAGNOSIS — J4489 Other specified chronic obstructive pulmonary disease: Secondary | ICD-10-CM | POA: Insufficient documentation

## 2010-12-27 DIAGNOSIS — J449 Chronic obstructive pulmonary disease, unspecified: Secondary | ICD-10-CM | POA: Insufficient documentation

## 2010-12-27 DIAGNOSIS — R079 Chest pain, unspecified: Secondary | ICD-10-CM | POA: Insufficient documentation

## 2010-12-27 DIAGNOSIS — F17200 Nicotine dependence, unspecified, uncomplicated: Secondary | ICD-10-CM | POA: Insufficient documentation

## 2010-12-27 DIAGNOSIS — R0602 Shortness of breath: Secondary | ICD-10-CM | POA: Insufficient documentation

## 2010-12-27 LAB — POCT I-STAT, CHEM 8
BUN: 9 mg/dL (ref 6–23)
Calcium, Ion: 1.12 mmol/L (ref 1.12–1.32)
Chloride: 105 mEq/L (ref 96–112)
Glucose, Bld: 99 mg/dL (ref 70–99)
HCT: 29 % — ABNORMAL LOW (ref 36.0–46.0)
Potassium: 3.9 mEq/L (ref 3.5–5.1)

## 2010-12-27 NOTE — Telephone Encounter (Signed)
Pt called and states she has had a cold for some time now.  Pt went to urgent care and was given a breathing treatment. Pt would like Dr. Scotty Court to call her back.  Pt states she does have a productive cough. Per Dr. Scotty Court pt is aware she needs to go to the ER.

## 2011-01-04 ENCOUNTER — Telehealth: Payer: Self-pay | Admitting: Family Medicine

## 2011-01-04 ENCOUNTER — Inpatient Hospital Stay (HOSPITAL_COMMUNITY)
Admission: EM | Admit: 2011-01-04 | Discharge: 2011-01-04 | Discharge status: Against Medical Advice | Payer: Self-pay | Attending: Obstetrics & Gynecology | Admitting: Obstetrics & Gynecology

## 2011-01-04 ENCOUNTER — Emergency Department (HOSPITAL_COMMUNITY)
Admission: EM | Admit: 2011-01-04 | Discharge: 2011-01-04 | Disposition: A | Discharge status: Home / Self Care | Payer: Self-pay | Attending: Emergency Medicine | Admitting: Emergency Medicine

## 2011-01-04 DIAGNOSIS — L02419 Cutaneous abscess of limb, unspecified: Secondary | ICD-10-CM | POA: Insufficient documentation

## 2011-01-04 DIAGNOSIS — L03119 Cellulitis of unspecified part of limb: Secondary | ICD-10-CM | POA: Insufficient documentation

## 2011-01-04 DIAGNOSIS — M7989 Other specified soft tissue disorders: Secondary | ICD-10-CM | POA: Insufficient documentation

## 2011-01-04 LAB — DIFFERENTIAL
Basophils Relative: 1 % (ref 0–1)
Eosinophils Relative: 4 % (ref 0–5)
Lymphs Abs: 3.9 10*3/uL (ref 0.7–4.0)
Monocytes Absolute: 1.2 10*3/uL — ABNORMAL HIGH (ref 0.1–1.0)
Monocytes Relative: 8 % (ref 3–12)
Neutro Abs: 9.1 10*3/uL — ABNORMAL HIGH (ref 1.7–7.7)

## 2011-01-04 LAB — CBC
HCT: 28.5 % — ABNORMAL LOW (ref 36.0–46.0)
Hemoglobin: 8.2 g/dL — ABNORMAL LOW (ref 12.0–15.0)
MCHC: 28.8 g/dL — ABNORMAL LOW (ref 30.0–36.0)
MCV: 52.5 fL — ABNORMAL LOW (ref 78.0–100.0)
RDW: 19 % — ABNORMAL HIGH (ref 11.5–15.5)

## 2011-01-04 LAB — COMPREHENSIVE METABOLIC PANEL
AST: 16 U/L (ref 0–37)
Albumin: 3.4 g/dL — ABNORMAL LOW (ref 3.5–5.2)
Alkaline Phosphatase: 88 U/L (ref 39–117)
BUN: 9 mg/dL (ref 6–23)
CO2: 25 mEq/L (ref 19–32)
Chloride: 100 mEq/L (ref 96–112)
Creatinine, Ser: <0.47 mg/dL — ABNORMAL LOW (ref 0.50–1.10)
Potassium: 3.9 mEq/L (ref 3.5–5.1)
Total Bilirubin: 0.2 mg/dL — ABNORMAL LOW (ref 0.3–1.2)

## 2011-01-04 NOTE — Telephone Encounter (Signed)
Pt was in hospital last night and was discharged and is now at work. Had blood taken while in hospital but did not receive results mother is concerned they are getting the run around. Pts mother requesting to be contacted ASAP.

## 2011-01-04 NOTE — Telephone Encounter (Signed)
Pt called and stated she went to ER for two weeks in a row and pt would like to have a follow up appt with Dr. Scotty Court. Per Dr. Scotty Court pt is aware that her labs show that she does have a bacterial infection. An appt was scheduled for 01/08/11 at 3 pm.

## 2011-01-04 NOTE — Telephone Encounter (Signed)
Called and left pt a message to return phone call.

## 2011-01-06 ENCOUNTER — Emergency Department (HOSPITAL_COMMUNITY): Payer: Self-pay

## 2011-01-06 ENCOUNTER — Emergency Department (HOSPITAL_COMMUNITY)
Admission: EM | Admit: 2011-01-06 | Discharge: 2011-01-06 | Disposition: A | Discharge status: Home / Self Care | Payer: Self-pay | Attending: Emergency Medicine | Admitting: Emergency Medicine

## 2011-01-06 DIAGNOSIS — M79609 Pain in unspecified limb: Secondary | ICD-10-CM | POA: Insufficient documentation

## 2011-01-06 DIAGNOSIS — I8000 Phlebitis and thrombophlebitis of superficial vessels of unspecified lower extremity: Secondary | ICD-10-CM

## 2011-01-06 DIAGNOSIS — M7989 Other specified soft tissue disorders: Secondary | ICD-10-CM | POA: Insufficient documentation

## 2011-01-06 DIAGNOSIS — R21 Rash and other nonspecific skin eruption: Secondary | ICD-10-CM | POA: Insufficient documentation

## 2011-01-06 DIAGNOSIS — J40 Bronchitis, not specified as acute or chronic: Secondary | ICD-10-CM | POA: Insufficient documentation

## 2011-01-08 ENCOUNTER — Ambulatory Visit (INDEPENDENT_AMBULATORY_CARE_PROVIDER_SITE_OTHER): Payer: Self-pay | Admitting: Family Medicine

## 2011-01-08 ENCOUNTER — Encounter: Payer: Self-pay | Admitting: Family Medicine

## 2011-01-08 VITALS — BP 100/60 | HR 108 | Temp 98.7°F | Wt 143.0 lb

## 2011-01-08 DIAGNOSIS — L02419 Cutaneous abscess of limb, unspecified: Secondary | ICD-10-CM

## 2011-01-08 DIAGNOSIS — L03119 Cellulitis of unspecified part of limb: Secondary | ICD-10-CM

## 2011-01-08 DIAGNOSIS — I809 Phlebitis and thrombophlebitis of unspecified site: Secondary | ICD-10-CM

## 2011-01-08 DIAGNOSIS — H00019 Hordeolum externum unspecified eye, unspecified eyelid: Secondary | ICD-10-CM

## 2011-01-08 DIAGNOSIS — H109 Unspecified conjunctivitis: Secondary | ICD-10-CM

## 2011-01-08 MED ORDER — HYDROCODONE-ACETAMINOPHEN 10-500 MG PO TABS: 1.0000 | ORAL_TABLET | Freq: Four times a day (QID) | ORAL | Status: AC | PRN

## 2011-01-08 NOTE — Patient Instructions (Addendum)
For superficial clots start aspirin 325mg , each day, apply heat, heating pad or hoy sokes  For 20-30 minutes Cellulitis appears much improved For callous on feet, sand off with sander then get dancers pad For stye and conjunctivitis use antibiotic drops 3 times per day, hot wash cloth to aplly 10-15 minutes 2-3 times per day  helps

## 2011-01-14 ENCOUNTER — Encounter: Payer: Self-pay | Admitting: Family Medicine

## 2011-01-14 NOTE — Progress Notes (Signed)
  Subjective:    Patient ID: Sharon Ortega, female    DOB: 05/23/1971, 40 y.o.   MRN: [id number] This 40 year old white divorce female is in today for followup visit to the emergency room at [facility]. Patient went to the emergency room complaining of pain in her leg left turnabout thrombophlebitis found to be superficial phlebitis as well as having an area of saline this on the lower leg or today she complains of continued pain in the leg as well as pain in both feet for a she is concerned about plantar warts. Other complaint is that of size right lower lid plus possible conjunctivitis HPI    Review of Systems he did review other than history of present illness     Objective:   Physical Exam the patient is a well-built slightly obese white female who is talking incessantly complain of pain in the left leg and the bottom of her feet Examination of the eyes reveals a stye in the right lower lid as well as the conjunctivitis bilaterally Examination of the left leg reveals superficial quit clot or tenderness over approximately 10 cm of 1 vein Examination of bottom of both feet reveal calluses        Assessment & Plan:  Superficial phlebitis start aspirin 325 mg each day as well as hot packs 20-30 minutes per day x3 Cellulitis of lower leg is improving Call lateral conjunctivitis treat with TobraDex ophthalmic drops 3 times a day Stye right lower lid I&D with needle

## 2011-01-24 ENCOUNTER — Emergency Department (HOSPITAL_COMMUNITY): Payer: Self-pay

## 2011-01-24 ENCOUNTER — Emergency Department (HOSPITAL_COMMUNITY)
Admission: EM | Admit: 2011-01-24 | Discharge: 2011-01-25 | Disposition: A | Discharge status: Home / Self Care | Payer: Self-pay | Attending: Emergency Medicine | Admitting: Emergency Medicine

## 2011-01-24 DIAGNOSIS — D509 Iron deficiency anemia, unspecified: Secondary | ICD-10-CM | POA: Insufficient documentation

## 2011-01-24 DIAGNOSIS — Z9884 Bariatric surgery status: Secondary | ICD-10-CM | POA: Insufficient documentation

## 2011-01-24 DIAGNOSIS — R109 Unspecified abdominal pain: Secondary | ICD-10-CM | POA: Insufficient documentation

## 2011-01-24 DIAGNOSIS — R079 Chest pain, unspecified: Secondary | ICD-10-CM | POA: Insufficient documentation

## 2011-01-24 LAB — COMPREHENSIVE METABOLIC PANEL
AST: 15 U/L (ref 0–37)
BUN: 9 mg/dL (ref 6–23)
CO2: 25 mEq/L (ref 19–32)
Calcium: 9.5 mg/dL (ref 8.4–10.5)
Chloride: 102 mEq/L (ref 96–112)
Creatinine, Ser: <0.47 mg/dL — ABNORMAL LOW (ref 0.50–1.10)
Glucose, Bld: 76 mg/dL (ref 70–99)
Total Bilirubin: 0.2 mg/dL — ABNORMAL LOW (ref 0.3–1.2)

## 2011-01-24 LAB — CBC
HCT: 27.5 % — ABNORMAL LOW (ref 36.0–46.0)
Hemoglobin: 8.1 g/dL — ABNORMAL LOW (ref 12.0–15.0)
MCH: 15.5 pg — ABNORMAL LOW (ref 26.0–34.0)
MCV: 52.8 fL — ABNORMAL LOW (ref 78.0–100.0)
RBC: 5.21 MIL/uL — ABNORMAL HIGH (ref 3.87–5.11)
WBC: 11.3 10*3/uL — ABNORMAL HIGH (ref 4.0–10.5)

## 2011-01-24 LAB — DIFFERENTIAL
Eosinophils Relative: 3 % (ref 0–5)
Lymphs Abs: 3.4 10*3/uL (ref 0.7–4.0)
Monocytes Absolute: 0.8 10*3/uL (ref 0.1–1.0)
Monocytes Relative: 7 % (ref 3–12)
Neutro Abs: 6.7 10*3/uL (ref 1.7–7.7)

## 2011-01-24 LAB — LIPASE, BLOOD: Lipase: 51 U/L (ref 11–59)

## 2011-01-24 LAB — POCT I-STAT TROPONIN I

## 2011-01-24 LAB — POCT PREGNANCY, URINE: Preg Test, Ur: NEGATIVE

## 2011-01-25 ENCOUNTER — Encounter (HOSPITAL_COMMUNITY): Payer: Self-pay

## 2011-01-25 LAB — URINALYSIS, ROUTINE W REFLEX MICROSCOPIC
Bilirubin Urine: NEGATIVE
Ketones, ur: NEGATIVE mg/dL
Nitrite: NEGATIVE
Protein, ur: NEGATIVE mg/dL
pH: 7.5 (ref 5.0–8.0)

## 2011-01-25 LAB — URINE MICROSCOPIC-ADD ON

## 2011-01-25 MED ORDER — IOHEXOL 300 MG/ML  SOLN
100.0000 mL | Freq: Once | INTRAMUSCULAR | Status: AC | PRN
  Administered 2011-01-25: 100 mL via INTRAVENOUS

## 2011-01-29 ENCOUNTER — Other Ambulatory Visit: Payer: Self-pay | Admitting: Family Medicine

## 2011-01-29 ENCOUNTER — Telehealth: Payer: Self-pay

## 2011-01-29 ENCOUNTER — Telehealth: Payer: Self-pay | Admitting: Family Medicine

## 2011-01-29 DIAGNOSIS — R52 Pain, unspecified: Secondary | ICD-10-CM

## 2011-01-29 DIAGNOSIS — K829 Disease of gallbladder, unspecified: Secondary | ICD-10-CM

## 2011-01-29 MED ORDER — OXYCODONE HCL 20 MG PO TABS: ORAL_TABLET | ORAL | Status: DC

## 2011-01-29 NOTE — Telephone Encounter (Signed)
Pt only given 1 prescription; rx did not print previously.  Pt is aware that rx can be picked up tomorrow.

## 2011-01-29 NOTE — Telephone Encounter (Signed)
Called pt to make aware that per Dr. Scotty Court she could have oxycodone prescription and it can be picked up tomorrow.  Pt is aware that if she worsens to go to the ER

## 2011-01-29 NOTE — Telephone Encounter (Signed)
Done spoke with pt and she is aware of ultrasound.

## 2011-01-29 NOTE — Telephone Encounter (Signed)
Pt called last Friday was in the hospital Thursday. Pt had CT scan the hospital told pt that nothing was wrong. Pt is still in a lot of pain and is hoping that Dr. Scotty Court would look at the CT scan to make sure it was red correctly. Anytime pt eats or drink as soon as it hits her stomach she is in pain. Please contact ot ASAP.

## 2011-01-29 NOTE — Telephone Encounter (Signed)
Called to discuss CT abdomen pelvis with pt and to make pt aware that she needed to go to for a ultrasound of her gallbladder.    Pt is aware that a referral will be sent for ultrasound at [facility].  Pt states when she takes a sip of water she has immediate pain.

## 2011-01-29 NOTE — Telephone Encounter (Signed)
Pt called and stated she went to ER and is having sharpe pains in her side.    Called pt and left a message for a return call.

## 2011-01-29 NOTE — Telephone Encounter (Signed)
Done

## 2011-01-30 ENCOUNTER — Telehealth: Payer: Self-pay | Admitting: Family Medicine

## 2011-01-30 NOTE — Telephone Encounter (Signed)
Pt wants to pick up copy to CT of Abdomen as soon as it is avail. Pls call when ready for pick up.

## 2011-01-30 NOTE — Telephone Encounter (Signed)
Pt aware that a copy of ct abdomen can be picked up.

## 2011-01-31 ENCOUNTER — Ambulatory Visit (HOSPITAL_COMMUNITY)
Admission: RE | Admit: 2011-01-31 | Discharge: 2011-01-31 | Disposition: A | Discharge status: Home / Self Care | Payer: Self-pay | Source: Ambulatory Visit | Attending: Family Medicine | Admitting: Family Medicine

## 2011-01-31 DIAGNOSIS — K8020 Calculus of gallbladder without cholecystitis without obstruction: Secondary | ICD-10-CM | POA: Insufficient documentation

## 2011-01-31 DIAGNOSIS — N1330 Unspecified hydronephrosis: Secondary | ICD-10-CM | POA: Insufficient documentation

## 2011-01-31 DIAGNOSIS — R52 Pain, unspecified: Secondary | ICD-10-CM

## 2011-01-31 DIAGNOSIS — Q619 Cystic kidney disease, unspecified: Secondary | ICD-10-CM | POA: Insufficient documentation

## 2011-01-31 DIAGNOSIS — K829 Disease of gallbladder, unspecified: Secondary | ICD-10-CM

## 2011-01-31 DIAGNOSIS — R109 Unspecified abdominal pain: Secondary | ICD-10-CM | POA: Insufficient documentation

## 2011-02-04 ENCOUNTER — Emergency Department (HOSPITAL_COMMUNITY)
Admission: EM | Admit: 2011-02-04 | Discharge: 2011-02-04 | Discharge status: Against Medical Advice | Payer: Self-pay | Attending: Emergency Medicine | Admitting: Emergency Medicine

## 2011-02-04 DIAGNOSIS — R1012 Left upper quadrant pain: Secondary | ICD-10-CM | POA: Insufficient documentation

## 2011-02-05 ENCOUNTER — Ambulatory Visit (INDEPENDENT_AMBULATORY_CARE_PROVIDER_SITE_OTHER): Payer: Self-pay | Admitting: Family Medicine

## 2011-02-05 ENCOUNTER — Ambulatory Visit: Payer: Self-pay | Admitting: Family Medicine

## 2011-02-05 ENCOUNTER — Observation Stay (HOSPITAL_COMMUNITY)
Admission: EM | Admit: 2011-02-05 | Discharge: 2011-02-06 | Disposition: A | Discharge status: Home / Self Care | Payer: Self-pay | Attending: Surgery | Admitting: Surgery

## 2011-02-05 ENCOUNTER — Encounter: Payer: Self-pay | Admitting: Family Medicine

## 2011-02-05 VITALS — BP 130/88 | HR 103 | Temp 98.2°F | Wt 141.0 lb

## 2011-02-05 DIAGNOSIS — R1012 Left upper quadrant pain: Secondary | ICD-10-CM | POA: Insufficient documentation

## 2011-02-05 DIAGNOSIS — D649 Anemia, unspecified: Secondary | ICD-10-CM | POA: Insufficient documentation

## 2011-02-05 DIAGNOSIS — R10812 Left upper quadrant abdominal tenderness: Secondary | ICD-10-CM

## 2011-02-05 DIAGNOSIS — K259 Gastric ulcer, unspecified as acute or chronic, without hemorrhage or perforation: Principal | ICD-10-CM | POA: Insufficient documentation

## 2011-02-05 DIAGNOSIS — K257 Chronic gastric ulcer without hemorrhage or perforation: Secondary | ICD-10-CM

## 2011-02-05 DIAGNOSIS — F341 Dysthymic disorder: Secondary | ICD-10-CM | POA: Insufficient documentation

## 2011-02-05 DIAGNOSIS — R1013 Epigastric pain: Secondary | ICD-10-CM | POA: Insufficient documentation

## 2011-02-05 DIAGNOSIS — E8809 Other disorders of plasma-protein metabolism, not elsewhere classified: Secondary | ICD-10-CM | POA: Insufficient documentation

## 2011-02-05 DIAGNOSIS — F17200 Nicotine dependence, unspecified, uncomplicated: Secondary | ICD-10-CM | POA: Insufficient documentation

## 2011-02-05 DIAGNOSIS — Z9884 Bariatric surgery status: Secondary | ICD-10-CM | POA: Insufficient documentation

## 2011-02-05 DIAGNOSIS — M542 Cervicalgia: Secondary | ICD-10-CM | POA: Insufficient documentation

## 2011-02-05 LAB — COMPREHENSIVE METABOLIC PANEL
ALT: 6 U/L (ref 0–35)
AST: 15 U/L (ref 0–37)
Albumin: 2.6 g/dL — ABNORMAL LOW (ref 3.5–5.2)
Alkaline Phosphatase: 79 U/L (ref 39–117)
BUN: 6 mg/dL (ref 6–23)
Potassium: 3.7 mEq/L (ref 3.5–5.1)
Sodium: 138 mEq/L (ref 135–145)
Total Protein: 6.2 g/dL (ref 6.0–8.3)

## 2011-02-05 LAB — DIFFERENTIAL
Basophils Absolute: 0.1 10*3/uL (ref 0.0–0.1)
Basophils Relative: 0 % (ref 0–1)
Lymphocytes Relative: 27 % (ref 12–46)
Neutro Abs: 7.9 10*3/uL — ABNORMAL HIGH (ref 1.7–7.7)
Neutrophils Relative %: 63 % (ref 43–77)

## 2011-02-05 LAB — CBC
HCT: 27.2 % — ABNORMAL LOW (ref 36.0–46.0)
Hemoglobin: 8 g/dL — ABNORMAL LOW (ref 12.0–15.0)
MCHC: 29.4 g/dL — ABNORMAL LOW (ref 30.0–36.0)
MCHC: 29.4 g/dL — ABNORMAL LOW (ref 30.0–36.0)
MCV: 52.9 fL — ABNORMAL LOW (ref 78.0–100.0)
Platelets: 366 10*3/uL (ref 150–400)
RDW: 18.7 % — ABNORMAL HIGH (ref 11.5–15.5)
WBC: 12.5 10*3/uL — ABNORMAL HIGH (ref 4.0–10.5)

## 2011-02-05 LAB — LIPASE, BLOOD: Lipase: 31 U/L (ref 11–59)

## 2011-02-06 LAB — IRON AND TIBC
Saturation Ratios: 3 % — ABNORMAL LOW (ref 20–55)
TIBC: 350 ug/dL (ref 250–470)

## 2011-02-06 LAB — VITAMIN B12: Vitamin B-12: 348 pg/mL (ref 211–911)

## 2011-02-06 LAB — FOLATE: Folate: 12.4 ng/mL

## 2011-02-07 ENCOUNTER — Telehealth: Payer: Self-pay | Admitting: Family Medicine

## 2011-02-07 LAB — CLOTEST (H. PYLORI), BIOPSY: Helicobacter screen: NEGATIVE

## 2011-02-07 NOTE — Telephone Encounter (Signed)
Pt requesting refill on oxyCODONE-acetaminophen (PERCOCET) 10-325 MG per tablet  Please contact when ready

## 2011-02-08 MED ORDER — OXYCODONE-ACETAMINOPHEN 10-325 MG PO TABS: ORAL_TABLET | ORAL | Status: DC

## 2011-02-08 NOTE — Telephone Encounter (Signed)
Ok per Dr. Scotty Court to fill pt's oxycodone-acetaminophen 10-325.

## 2011-02-08 NOTE — Telephone Encounter (Signed)
Pt is aware rx is ready to pick up. 

## 2011-02-11 NOTE — Progress Notes (Signed)
  Subjective:    Patient ID: Sharon Ortega, female    DOB: 1971-04-14, 40 y.o.   MRN: [id number] This 40 year old white divorced female with a past history of bariatric surgery has been seen in the emergency room on 816 well as went to the emergency room one day previously and was not same but has continued to have pain in the upper abdomen especially on the left side pain radiating to the back and up into the left are heard for the patient agrees has nausea relates she has not eaten in 2 days but has taken her oxycodone we had had ultrasound of the gallbladder which did reveal stones not thought to be cause of her pain at this time but will need to have surgery later date HPI    Review of Systems no other symptoms on system review see history of present illness     Objective:   Physical Exam patient is a well-built well-nourished white female who is in distress complaining of severe pain in the left upper abdomen Aren't lungs reveal no abnormality is Abdominal exam reveals marked tenderness in the left upper quadrant as well as the epigastrium bowel sounds are quiet is suggestive rebound tenderness Rectal exam not done        Assessment & Plan:  Acute abdominal pain diagnosis deferred part referred to Dr.  Ovidio Kin does bariatric surgery

## 2011-02-11 NOTE — Patient Instructions (Signed)
Referred to Dr. Ovidio Kin surgeon is that once along hospital go to the emergency room and he will see you there

## 2011-02-12 NOTE — Op Note (Signed)
Sharon Ortega, DENIS               ACCOUNT NO.:  [id number]  MEDICAL RECORD NO.:  [id number]  LOCATION:  1530                         FACILITY:  [facility]  PHYSICIAN:  Sandria Bales. Ezzard Standing, M.D.  DATE OF BIRTH:  15-Nov-1970  DATE OF PROCEDURE:  02/06/2011                               OPERATIVE REPORT  PREOPERATIVE DIAGNOSES:  History of Roux-en-Y gastric bypass, epigastric pain.  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  PROCEDURE:  Esophagogastroduodenoscopy with biopsy for CLO-test.  SURGEON:  Sandria Bales. Ezzard Standing, MD  ASSISTANT:  No first assistant.  ANESTHESIA: 1. 75 mcg of fentanyl. 2. 7 mg of Versed.  COMPLICATIONS:  None.  INDICATIONS FOR PROCEDURE:  Ms. Judy is a 40 year old white female who sees Dr. Cecille Rubin as her medical doctor locally.  She had a Roux-en- Y gastric bypass in approximately May 2006 in [place], [place], but she is unsure of the name of the physician who did the surgery of her bypass.  She successfully lost about 200 pounds.  Unfortunately she has taken up smoking.  About 3 weeks' ago developed epigastric abdominal pain.  She was admitted yesterday by Dr. Gaynelle Adu.  She comes to endoscopy suite today for endoscopy and evaluation for her pouch and possible ulcer disease.  OPERATIVE NOTE:  The patient placed in a left lateral decubitus position.  The back of her throat was anesthetized with Cetacaine.  She had a blood pressure cuff and EKG and pulse oximeter on.  She was given 2 L of nasal O2.  She was given a total of 75 mcg of fentanyl, 7 mg of Versed and a flexible Pentax endoscope was passed down the back of her throat without difficulty.  I advanced the scope into the gastric pouch.  Her gastrojejunal anastomosis was at about 45 cm.  Immediately beyond the gastrojejunal anastomosis was a marginal ulcer.  I tried to advance the scope down the small-bowel but could not  negotiate it very well.  Since I thought I found the diagnosis, I did not want to force the scope at all. So I could only get maybe 4 or 5 cm into her jejunum beyond her anastomosis.  She also has a history of possibly having a ring placed around her anastomosis.  How this played a role I do not know; I did not see any kind of material that had eroded that was obvious to me.  Her gastric pouch had a bland mucosa.  I did do a biopsy of the mucosa for a CLO-test.  Her esophagogastric junction was about 36/37 cm for about an 8 to 9 cm pouch.  Her esophagus was unremarkable.  The scope was then withdrawn.  The patient tolerated the procedure well.  I think her diagnosis is a marginal ulcer, this was the source of her pain.  She needs to quit smoking cigarettes.  We can try to get her on a proton-pump inhibitor and Carafate as a treatment, and try to get her back in touch with her original bypass doctors.   Sandria Bales. Ezzard Standing, M.D., FACS    DHN/MEDQ  D:  02/06/2011  T:  02/06/2011  Job:  [id number]  cc:   Ellin Saba., MD [address] [place] [address]  Electronically Signed by Ovidio Kin M.D. on 02/12/2011 09:37:02 AM

## 2011-02-14 NOTE — H&P (Signed)
NAMECHRISTEL, Sharon Ortega               ACCOUNT NO.:  [id number]  MEDICAL RECORD NO.:  [id number]  LOCATION:  1530                         FACILITY:  [facility]  PHYSICIAN:  Mary Sella. Andrey Campanile, MD     DATE OF BIRTH:  Sep 17, 1970  DATE OF ADMISSION:  02/05/2011 DATE OF DISCHARGE:                             HISTORY & PHYSICAL   REFERRING PHYSICIAN:  Tawny Asal, MD  PRIMARY CARE PHYSICIAN:  Tawny Asal, MD  CHIEF COMPLAINT:  Left upper quadrant pain.  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  Sharon Ortega has not seen her bariatric surgeon in over 4 years.  Sharon Ortega was unaware that Sharon Ortega needed to have annual followup with the bariatric surgeon.  Sharon Ortega cannot recall his name or the name of the hospital where Sharon Ortega had the procedure done.  Sharon Ortega states that Sharon Ortega had a ring placed around her pouch, so it would not dilate over time.  Sharon Ortega has lost a total of 200 pounds since undergoing gastric bypass surgery.  Sharon Ortega was in her usual.  Sharon Ortega takes chronic narcotics for chronic neck pain. Sharon Ortega describes her pain as being on the left side.  It is constant and radiates to her back.  The pain worsens with any oral intake.  This has essentially gotten worse over the past couple of weeks.  The pain will spike immediately after drinking or eating liquids or foods.  As a result, Sharon Ortega has had a decreased intake.  Sharon Ortega has actually gone to the emergency room on the 17th where a CT scan of her chest, abdomen, and pelvis was performed.  Sharon Ortega is a smoker and smokes 1 pack per day.  Sharon Ortega denies any NSAID use.  Sharon Ortega does have associated nausea.  Sharon Ortega will occasionally have some white foamies, but Sharon Ortega has not had any emesis. Sharon Ortega has seen her primary care physician  and he ultimately contacted Dr. Ovidio Kin today, who recommended the patient come to the emergency room for evaluation.  Sharon Ortega has had an ultrasound to follow up on gallstones that were seen on her CT scan.  PAST MEDICAL HISTORY: 1. Chronic neck pain. 2. Remote history of morbid obesity. 3. Tobacco dependence. 4. Genital herpes.  PAST SURGICAL HISTORY: 1. C-section x2. 2. Tubal ligation. 3. 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  ALLERGIES:  None.  MEDICATIONS:  Flexeril, oxycodone, and occasional Valtrex.  REVIEW OF SYSTEMS:  Comprehensive 12-point review of systems was performed.  All systems are specifically negative except for what as mentioned in the HPI.  Sharon Ortega specifically denies chest pain, shortness of breath, hematuria, dysuria, melena, hematochezia, dyspnea on exertion, or orthopnea.  SOCIAL HISTORY:  Sharon Ortega smokes 1 pack per day.  Sharon Ortega denies any alcohol or drugs.  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  FAMILY HISTORY:  Remarkable for uterine cancer, lung cancer, breast cancer in maternal aunt, and diabetes.  Her father  died from a myocardial infarction in his 20s.  PHYSICAL EXAMINATION:  VITAL SIGNS:  Temperature 98.1, heart rate 87, blood pressure 96/62, respirations 20, O2 sat 100%. GENERAL:  Well-developed, well-nourished Caucasian female, who is nontoxic, who is agitated. HEENT:  Atraumatic, normocephalic.  Pupils are equal.  No scleral icterus. NECK:  Supple.  No lymphadenopathy.  Trachea is midline. PULMONARY:  Lungs are clear and symmetric. CHEST:  No accessory use of muscles. CARDIOVASCULAR:  Regular rate and rhythm.  2+ radial pulse. ABDOMEN:  Soft and nondistended.  Sharon Ortega has tenderness on her left side. No rebound.  Well-healed trocar incisions.  No signs of incisional hernia or trocar site hernias.  No right upper quadrant tenderness. MUSCULOSKELETAL:  Free range of motion, moves all extremities.  No obvious  joint deformity. SKIN:  No jaundice.  No rash.  No edema. NEURO:  Nonfocal.  Sensation grossly intact. PSYCHIATRIC:  Sharon Ortega is alert and oriented x3; however, Sharon Ortega appears somewhat frustrated and teary at times.  LABS:  Sodium 138, potassium 3.7, chloride 104, bicarb 26, BUN 6, creatinine 0.47, blood sugar 89, calcium 9.3.  LFTs normal.  Albumin is low at 2.6, lipase is 31.  White blood cell count 12.5, hemoglobin 8.5, hematocrit 28.9, platelet count 366.  RADIOGRAPHS: 1. Ultrasound of abdomen and pelvis, August 23, showed multiple     gallstones, no common bile duct stones, no wall thickness.  Sharon Ortega had     a small right kidney cyst and low-grade left hydronephrosis. 2. CT scan of chest, abdomen, and pelvis, January 25, 2011, no PE, only     gallstones, probable uterine fibroids.  No signs of internal     hernia.  Gastric pouch did not look grossly abnormal, neither did     the remnant stomach.  IMPRESSION:  A 40 year old Caucasian female, status post laparoscopic Roux-en-Y gastric bypass with possible Fobi pouch with 1. Left-sided abdominal pain, probable ulcer. 2. Hypoalbuminemia. 3. Anemia. 4. Tobacco abuse. 5. Chronic neck pain.  PLAN:  We are going to admit her for pain control and nausea control. This sounds most like an ulcer, however, other possibilities included a gastrogastric fistula and anastomotic stricture.  Her symptoms are not entirely consistent with symptomatic cholelithiasis.  I believe given her history of ongoing tobacco abuse in the setting of a gastric bypass, we need to be most concerned for an anastomotic ulcer and need to rule out that first.  We tentatively plan to do an upper endoscopy in the morning by Dr. Ezzard Standing; if he is unable to do it, then I will do it later that afternoon.  We will give her antiemetics, Carafate, and antiulcer medication.  We will hold DVT prophylaxis for now considering Sharon Ortega is going to have an invasive procedure in the morning.  We  will  also check routine vitamin levels and an anemia panel in the morning.     Mary Sella. Andrey Campanile, MD     EMW/MEDQ  D:  02/05/2011  T:  02/06/2011  Job:  [id number]  cc:   Ellin Saba., MD [address] [place] [address]  Electronically Signed by Gaynelle Adu M.D. on 02/14/2011 02:13:08 PM

## 2011-02-19 ENCOUNTER — Ambulatory Visit: Payer: Self-pay | Admitting: Family Medicine

## 2011-03-11 ENCOUNTER — Other Ambulatory Visit: Payer: Self-pay | Admitting: Family Medicine

## 2011-03-11 NOTE — Telephone Encounter (Signed)
Pt called again about cough please contact.

## 2011-03-11 NOTE — Telephone Encounter (Signed)
Pt has bronchitus symptoms and is req refills of amoxicillin (AMOXIL) 500 MG capsule and Hydrocodone-Homatropine cough syrup to [facility] on [address]. Pt is leaving to go out of town on thurs and is req to pick up med before then.

## 2011-03-12 MED ORDER — HYDROCODONE-HOMATROPINE 5-1.5 MG/5ML PO SYRP: 5.0000 mL | ORAL_SOLUTION | Freq: Four times a day (QID) | ORAL | Status: AC | PRN

## 2011-03-12 MED ORDER — AMOXICILLIN 500 MG PO CAPS: 500.0000 mg | ORAL_CAPSULE | Freq: Three times a day (TID) | ORAL | Status: AC

## 2011-03-12 NOTE — Telephone Encounter (Signed)
Pt aware rx has been called in to pharmacy.   

## 2011-03-19 LAB — POCT URINALYSIS DIP (DEVICE)
Operator id: 247071
Protein, ur: NEGATIVE
Specific Gravity, Urine: 1.02
Urobilinogen, UA: 0.2

## 2011-03-19 LAB — HERPES SIMPLEX VIRUS CULTURE: Culture: DETECTED

## 2011-03-20 ENCOUNTER — Telehealth: Payer: Self-pay | Admitting: Family Medicine

## 2011-03-20 MED ORDER — ALBUTEROL SULFATE HFA 108 (90 BASE) MCG/ACT IN AERS: 2.0000 | INHALATION_SPRAY | Freq: Four times a day (QID) | RESPIRATORY_TRACT | Status: DC | PRN

## 2011-03-20 NOTE — Telephone Encounter (Signed)
Called to make pt aware that per Dr. Scotty Court pt needs to be seen in [place] if she is sick.  Ok per Dr. Scotty Court to call in an inhaler.

## 2011-03-20 NOTE — Telephone Encounter (Signed)
Pt is out of town in [place], [place]. Pt req refill of generic inhaler and cough syrup to be called in to [facility] [phone] and the Fax # [phone].  Pt is having extreme breathing diff due to asthma and allergies.

## 2011-03-21 NOTE — Telephone Encounter (Signed)
Pt is aware. Pt stated she received her inhaler.

## 2011-03-22 ENCOUNTER — Other Ambulatory Visit: Payer: Self-pay | Admitting: Family Medicine

## 2011-03-25 LAB — CBC
MCHC: 31.9
MCV: 61.9 — ABNORMAL LOW
Platelets: 259
RBC: 6.32 — ABNORMAL HIGH
WBC: 16.4 — ABNORMAL HIGH

## 2011-03-25 LAB — DIFFERENTIAL
Eosinophils Absolute: 0.2
Eosinophils Relative: 1
Lymphocytes Relative: 15
Monocytes Absolute: 1.3 — ABNORMAL HIGH
Neutrophils Relative %: 76

## 2011-03-25 LAB — URINALYSIS, ROUTINE W REFLEX MICROSCOPIC
Glucose, UA: NEGATIVE
Glucose, UA: NEGATIVE
Ketones, ur: 15 — AB
Ketones, ur: NEGATIVE
Nitrite: POSITIVE — AB
Protein, ur: NEGATIVE
Specific Gravity, Urine: 1.026
pH: 6

## 2011-03-25 LAB — URINE CULTURE: Colony Count: 15000

## 2011-03-25 LAB — POCT I-STAT CREATININE: Creatinine, Ser: 0.8

## 2011-03-25 LAB — I-STAT 8, (EC8 V) (CONVERTED LAB)
BUN: 8
Chloride: 105
Glucose, Bld: 86
pCO2, Ven: 38.7 — ABNORMAL LOW
pH, Ven: 7.405 — ABNORMAL HIGH

## 2011-03-25 LAB — URINE MICROSCOPIC-ADD ON

## 2011-03-25 LAB — POCT PREGNANCY, URINE: Operator id: 277751

## 2011-03-25 NOTE — Telephone Encounter (Signed)
Pt call checking on status of cough med. Pt is aware doc out of office until 10-23

## 2011-04-07 ENCOUNTER — Other Ambulatory Visit: Payer: Self-pay | Admitting: Family Medicine

## 2011-04-08 NOTE — Telephone Encounter (Signed)
Please see previous note.  Clarify if she is on hydrocodone, Oxycodone, or both.

## 2011-04-08 NOTE — Telephone Encounter (Signed)
Pt called to check on status of getting refill of Hydrocodone. Pt aware pcp out of office.

## 2011-04-08 NOTE — Telephone Encounter (Signed)
It appears that she is on both oxycodone and hydrocodone.  I do not feel comfortable with her being on both. Please clarify if she is on both meds.

## 2011-04-08 NOTE — Telephone Encounter (Signed)
Pls advise.  

## 2011-04-09 NOTE — Telephone Encounter (Signed)
Spoke with pt and she states that she is only taking the hydrocodone prescription.

## 2011-04-09 NOTE — Telephone Encounter (Signed)
Refill once month only

## 2011-04-10 ENCOUNTER — Other Ambulatory Visit: Payer: Self-pay | Admitting: Family Medicine

## 2011-04-10 NOTE — Telephone Encounter (Signed)
Pt called to check on status of refill for HYDROcodone-acetaminophen (NORCO) 10-325 MG per tablet  That she req on Friday. Pls call in today to [facility] on [address]

## 2011-04-10 NOTE — Telephone Encounter (Signed)
Ok per Dr. Caryl Never to fill #[phone] with 0 refills.  Called pt to make aware that an office visit is needed before her prescription runs out.

## 2011-08-09 ENCOUNTER — Encounter: Payer: Self-pay | Admitting: Internal Medicine

## 2011-08-09 DIAGNOSIS — F329 Major depressive disorder, single episode, unspecified: Secondary | ICD-10-CM | POA: Insufficient documentation

## 2011-08-09 DIAGNOSIS — N200 Calculus of kidney: Secondary | ICD-10-CM

## 2011-08-09 DIAGNOSIS — Z114 Encounter for screening for human immunodeficiency virus [HIV]: Secondary | ICD-10-CM | POA: Insufficient documentation

## 2011-08-09 DIAGNOSIS — Z0000 Encounter for general adult medical examination without abnormal findings: Secondary | ICD-10-CM | POA: Insufficient documentation

## 2011-08-09 HISTORY — DX: Calculus of kidney: N20.0

## 2011-08-15 ENCOUNTER — Encounter: Payer: Self-pay | Admitting: Internal Medicine

## 2011-08-15 ENCOUNTER — Ambulatory Visit (INDEPENDENT_AMBULATORY_CARE_PROVIDER_SITE_OTHER): Payer: Self-pay | Admitting: Internal Medicine

## 2011-08-15 VITALS — BP 102/70 | HR 92 | Temp 98.1°F | Ht 65.0 in | Wt 136.5 lb

## 2011-08-15 DIAGNOSIS — G8929 Other chronic pain: Secondary | ICD-10-CM | POA: Insufficient documentation

## 2011-08-15 DIAGNOSIS — F341 Dysthymic disorder: Secondary | ICD-10-CM

## 2011-08-15 HISTORY — DX: Other chronic pain: G89.29

## 2011-08-15 MED ORDER — FLUOXETINE HCL 20 MG PO CAPS: 20.0000 mg | ORAL_CAPSULE | Freq: Every day | ORAL | Status: DC

## 2011-08-15 MED ORDER — OXYCODONE HCL 10 MG PO TABS: ORAL_TABLET | ORAL | Status: DC

## 2011-08-15 MED ORDER — HYDROCODONE-ACETAMINOPHEN 10-325 MG PO TABS: 1.0000 | ORAL_TABLET | Freq: Four times a day (QID) | ORAL | Status: DC | PRN

## 2011-08-15 NOTE — Assessment & Plan Note (Signed)
With decrease sens to the Left hand, though cant r/o CTS;

## 2011-08-15 NOTE — Patient Instructions (Signed)
Take all new medications as prescribed Continue all other medications as before Please return in 6 months, or sooner if needed 

## 2011-08-18 ENCOUNTER — Encounter: Payer: Self-pay | Admitting: Internal Medicine

## 2011-08-18 NOTE — Assessment & Plan Note (Signed)
Ok to change effexor to prozac 20 qd, declines counseling,  to f/u any worsening symptoms or concerns

## 2011-08-18 NOTE — Assessment & Plan Note (Signed)
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

## 2011-08-18 NOTE — Progress Notes (Signed)
  Subjective:    Patient ID: Sharon Ortega, female    DOB: January 08, 1971, 41 y.o.   MRN: [id number]  HPI  Here to establish as new pt, former pt of Dr Scotty Court, works full time again after lost insurance 2009, but no benefits, needs pain med refills as per Dr Scotty Court ; has ongoing chronic LUE/neck pain c/w cervical radiculopathy and cannot afford eval or tx.  Has mult stressors, asks for prozac since less expensive than effexor cashpay.   Past Medical History  Diagnosis Date  . Depression   . Encounter for HIV (human immunodeficiency virus) test     testing  . Renal stones 08/09/2011  . Arthritis   . Bronchitis   . Hx of blood clots    Past Surgical History  Procedure Date  . Cesarean section     x2  . Gastric bypass   . 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  She has been smoking about 1 pack per day. She has never used smokeless tobacco. She reports that she uses illicit drugs (Hydrocodone). She reports that she does not drink alcohol. family history includes Arthritis in her other; Cancer in her maternal grandmother, other, and paternal grandmother; Hypertension in her other; Kidney disease in her other; and Mental retardation in her maternal grandfather, maternal grandmother, paternal grandfather, and paternal grandmother. No Known Allergies No current outpatient prescriptions on file prior to visit.   Review of Systems All otherwise neg per pt     Objective:   Physical Exam BP 102/70  Pulse 92  Temp(Src) 98.1 F (36.7 C) (Oral)  Ht 5\' 5"  (1.651 m)  Wt 136 lb 8 oz (61.916 kg)  BMI 22.71 kg/m2  SpO2 99%  LMP 07/26/2011 Physical Exam  VS noted Constitutional: Pt appears well-developed and well-nourished.  HENT: Head: Normocephalic.  Right Ear: External ear normal.  Left Ear: External ear normal.  Eyes: Conjunctivae and EOM are normal. Pupils are equal, round, and reactive to light.  Neck: Normal range of motion.  Neck supple.  Cardiovascular: Normal rate and regular rhythm.   Pulmonary/Chest: Effort normal and breath sounds normal.  Neurological: Pt is alert. No cranial nerve deficit. motor with 4+/5 LUE weakness Skin: Skin is warm. No erythema.  Psychiatric: Pt behavior is normal. Thought content normal.     Assessment & Plan:

## 2011-09-26 ENCOUNTER — Other Ambulatory Visit: Payer: Self-pay

## 2011-09-26 DIAGNOSIS — G8929 Other chronic pain: Secondary | ICD-10-CM

## 2011-09-26 MED ORDER — OXYCODONE HCL 10 MG PO TABS: ORAL_TABLET | ORAL | Status: DC

## 2011-09-26 NOTE — Telephone Encounter (Signed)
Done hardcopy to robin  

## 2011-09-26 NOTE — Telephone Encounter (Signed)
Called informed the patient prescription requested is ready for pickup at the front desk. 

## 2011-10-24 ENCOUNTER — Other Ambulatory Visit (INDEPENDENT_AMBULATORY_CARE_PROVIDER_SITE_OTHER): Payer: Self-pay

## 2011-10-24 ENCOUNTER — Encounter: Payer: Self-pay | Admitting: Internal Medicine

## 2011-10-24 ENCOUNTER — Ambulatory Visit (INDEPENDENT_AMBULATORY_CARE_PROVIDER_SITE_OTHER): Payer: Self-pay | Admitting: Internal Medicine

## 2011-10-24 VITALS — BP 110/62 | HR 92 | Temp 98.4°F | Ht 64.0 in | Wt 135.1 lb

## 2011-10-24 DIAGNOSIS — R1012 Left upper quadrant pain: Secondary | ICD-10-CM

## 2011-10-24 DIAGNOSIS — F329 Major depressive disorder, single episode, unspecified: Secondary | ICD-10-CM

## 2011-10-24 DIAGNOSIS — G8929 Other chronic pain: Secondary | ICD-10-CM

## 2011-10-24 LAB — CBC WITH DIFFERENTIAL/PLATELET
Hemoglobin: 7.8 g/dL — CL (ref 12.0–15.0)
MCHC: 29.6 g/dL — ABNORMAL LOW (ref 30.0–36.0)
MCV: 52.3 fl — ABNORMAL LOW (ref 78.0–100.0)
RDW: 21.3 % — ABNORMAL HIGH (ref 11.5–14.6)

## 2011-10-24 MED ORDER — OXYCODONE HCL 10 MG PO TABS: ORAL_TABLET | ORAL | Status: DC

## 2011-10-24 MED ORDER — OMEPRAZOLE MAGNESIUM 20 MG PO TBEC: 20.0000 mg | DELAYED_RELEASE_TABLET | Freq: Two times a day (BID) | ORAL | Status: DC

## 2011-10-24 MED ORDER — SUCRALFATE 1 GM/10ML PO SUSP: 1.0000 g | Freq: Four times a day (QID) | ORAL | Status: DC

## 2011-10-24 NOTE — Assessment & Plan Note (Signed)
stable overall by hx and exam, most recent data reviewed with pt, and pt to continue medical treatment as before Lab Results  Component Value Date   WBC 10.7* 02/05/2011   HGB 8.0* 02/05/2011   HCT 27.2* 02/05/2011   PLT 326 02/05/2011   GLUCOSE 89 02/05/2011   CHOL 189 03/08/2008   TRIG 82 03/08/2008   HDL 44 03/08/2008   LDLCALC 129* 03/08/2008   ALT 6 02/05/2011   AST 15 02/05/2011   NA 138 02/05/2011   K 3.7 02/05/2011   CL 104 02/05/2011   CREATININE <0.47* 02/05/2011   BUN 6 02/05/2011   CO2 26 02/05/2011   TSH 2.248 03/08/2008   INR 1.16 01/04/2011

## 2011-10-24 NOTE — Assessment & Plan Note (Signed)
Stable, refill med today, pt to not overuse med

## 2011-10-24 NOTE — Progress Notes (Signed)
Subjective:    Patient ID: Sharon Ortega, female    DOB: March 14, 1971, 41 y.o.   MRN: [id number]  HPI  Here with 2 wks onset upper and LUQ discomfort similar to sept 2012 when she underwent EGD with Dr Ezzard Standing with marginal ulceration noted in the setting of post gastric bypass;  tx with PPI and carafate and did very well, has been off all med for several months, but now symptoms starting again, mod to severe per pt so much so she used her oxycodone more than prescribed and also asks for forgiveness for this.  Chronic pain overall stable.  No n/v, fever, abd distesnion, bloating, change in bowels or constipation, worsening back pain or blood.    Denies worsening depressive symptoms, suicidal ideation, or panic, though has ongoing anxiety Past Medical History  Diagnosis Date  . Depression   . Encounter for HIV (human immunodeficiency virus) test     testing  . Renal stones 08/09/2011  . Arthritis   . Bronchitis   . Hx of blood clots   . 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 NECK PAIN, CHRONIC 02/08/2009    Qualifier: Diagnosis of  By: Alphonzo Severance MD, Loni Dolly CERVICAL RADICULOPATHY 10/25/2008    Qualifier: Diagnosis of  By: Alphonzo Severance MD, Loni Dolly ANXIETY DEPRESSION 05/31/2008    Qualifier: Diagnosis of  By: Alphonzo Severance MD, Loni Dolly TOBACCO ABUSE 01/30/2010    Qualifier: Diagnosis of  By: Abner Greenspan MD, Misty Stanley    . PAP SMEAR, ABNORMAL 05/22/2010    Qualifier: Diagnosis of  By: Alphonzo Severance MD, Loni Dolly GENITAL HERPES, HX OF 03/15/2008   Qualifier: Diagnosis of  By: Alphonzo Severance MD, Loni Dolly Bariatric surgery status 02/16/2008    Qualifier: Diagnosis of  By: Alphonzo Severance MD, Loni Dolly    Past Surgical History  Procedure Date  . Cesarean section     x2  . Gastric bypass   . 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  She has been smoking about 1 pack per day. She has never used smokeless tobacco. She reports that she uses illicit drugs (Hydrocodone). She reports that she does not drink alcohol. family history includes Arthritis in her other; Cancer in her maternal grandmother, other, and paternal grandmother; Hypertension in her other; Kidney disease in her other; and Mental retardation in her maternal grandfather, maternal grandmother, paternal grandfather, and paternal grandmother. No Known Allergies Current Outpatient Prescriptions on File Prior to Visit  Medication Sig Dispense Refill  . FLUoxetine (PROZAC) 20 MG capsule Take 1 capsule (20 mg total) by mouth daily.  90 capsule  3  . HYDROcodone-acetaminophen (NORCO) 10-325 MG per tablet Take 1 tablet by mouth every 6 (six) hours as needed for pain.  120 tablet  5  . omeprazole (PRILOSEC OTC) 20 MG tablet Take 1 tablet (20 mg total) by mouth 2 (two) times  daily.  60 tablet  5  . sucralfate (CARAFATE) 1 GM/10ML suspension Take 10 mLs (1 g total) by mouth 4 (four) times daily.  420 mL  0  . DISCONTD: albuterol (PROAIR HFA) 108 (90 BASE) MCG/ACT inhaler Inhale 2 puffs into the lungs every 6 (six) hours as needed for wheezing.  18 g  0    Review of Systems Constitutional: Negative for diaphoresis and unexpected weight change.  Eyes: Negative for photophobia and visual disturbance.  Respiratory: Negative for choking and stridor.   Gastrointestinal: Negative for vomiting and blood in stool.  Genitourinary: Negative for hematuria and decreased urine volume.  Musculoskeletal: Negative for gait problem.  Skin: Negative  for color change and wound.  Neurological: Negative for tremors and numbness.  Psychiatric/Behavioral: Negative for decreased concentration. The patient is not hyperactive.      Objective:   Physical Exam BP 110/62  Pulse 92  Temp(Src) 98.4 F (36.9 C) (Oral)  Ht 5\' 4"  (1.626 m)  Wt 135 lb 2 oz (61.292 kg)  BMI 23.19 kg/m2  SpO2 97% Physical Exam  VS noted Constitutional: Pt appears well-developed and well-nourished.  HENT: Head: Normocephalic.  Right Ear: External ear normal.  Left Ear: External ear normal.  Eyes: Conjunctivae and EOM are normal. Pupils are equal, round, and reactive to light.  Neck: Normal range of motion. Neck supple.  Cardiovascular: Normal rate and regular rhythm.   Pulmonary/Chest: Effort normal and breath sounds normal.  Abd:  Soft, NT, non-distended, + BS except for mild to  Mod LUQ tender without guarding or rebound Neurological: Pt is alert. Not confused Skin: Skin is warm. No erythema.  Psychiatric: Pt behavior is normal. Thought content normal. 2+ nervous    Assessment & Plan:

## 2011-10-24 NOTE — Patient Instructions (Signed)
Take all new medications as prescribed Continue all other medications as before Please take the OTC prilosec 20 mg at twice per day Please go to LAB in the Basement for the blood and/or urine tests to be done today You will be contacted by phone if any changes need to be made immediately.  Otherwise, you will receive a letter about your results with an explanation.

## 2011-10-24 NOTE — Assessment & Plan Note (Signed)
I think reasonable to agree with pt this may be ulceration recurrent; for re-start PPI and carafate, for labs today,  to f/u any worsening symptoms or concerns, declines GI referral due to cost

## 2011-10-25 ENCOUNTER — Encounter: Payer: Self-pay | Admitting: Internal Medicine

## 2011-10-25 ENCOUNTER — Telehealth: Payer: Self-pay

## 2011-10-25 ENCOUNTER — Telehealth: Payer: Self-pay | Admitting: Internal Medicine

## 2011-10-25 DIAGNOSIS — R1012 Left upper quadrant pain: Secondary | ICD-10-CM

## 2011-10-25 DIAGNOSIS — D649 Anemia, unspecified: Secondary | ICD-10-CM

## 2011-10-25 LAB — BASIC METABOLIC PANEL
CO2: 25 mEq/L (ref 19–32)
Chloride: 109 mEq/L (ref 96–112)
Sodium: 140 mEq/L (ref 135–145)

## 2011-10-25 LAB — HEPATIC FUNCTION PANEL
ALT: 10 U/L (ref 0–35)
Alkaline Phosphatase: 85 U/L (ref 39–117)
Bilirubin, Direct: 0 mg/dL (ref 0.0–0.3)
Total Bilirubin: 0.4 mg/dL (ref 0.3–1.2)
Total Protein: 6.4 g/dL (ref 6.0–8.3)

## 2011-10-25 LAB — IBC PANEL: Transferrin: 339.6 mg/dL (ref 212.0–360.0)

## 2011-10-25 NOTE — Telephone Encounter (Signed)
[facility] Triage Call Report Triage Record Num: [id number] Operator: Laren Boom Patient Name: Sharon [facility] Call Date & Time: 10/24/2011 6:01:15PM Patient Phone: [phone] PCP: Oliver Barre Patient Gender: Female PCP Fax : [phone] Patient DOB: 05/20/1971 Practice Name: [facility] Reason for Call: Caller: Eunice Blase; PCP: Oliver Barre; CB#: [phone]; Call regarding Eunice Blase is calling from [facility] regarding a CBC ordered on [place] by Oliver Barre.; Lab Called with Hgb Result of 7.8. Drawn 5:06 pm on 10/24/11. Ordered by Dr Oliver Barre.Last Hgb Draw 02/05/11 was 8.0. Call Back #'s for patient [phone] or [phone]. Pt seen for Chronic Pain Issue - Nausea and Abdominal Pain. Placed on Carafate, Prilosec and Oxycodone. Called Dr Amador Cunas and Jovita Gamma Full Report. He will call patient if neccessary. Protocol(s) Used: Office Note Recommended Outcome per Protocol: Information Noted and Sent to Office Reason for Outcome: Caller information to office Care Advice: ~ 10/24/2011 7:02:52PM Page 1 of 1 [id number]

## 2011-10-25 NOTE — Telephone Encounter (Signed)
Ok for early refill per Dr Jonny Ruiz. Pt and pharmacy advised of same.

## 2011-10-25 NOTE — Telephone Encounter (Signed)
Referral done  Robin to send addon:  Iron panel  285.9

## 2011-10-25 NOTE — Telephone Encounter (Signed)
Sharon Ortega to contact pt  Has severe chronic anemia, although not clear she is getting worse at this time  I would like to refer to GI for further evaluation, and add iron lab to her labs - is this ok?  (to ask pt, she doesn't have insurance)

## 2011-10-25 NOTE — Telephone Encounter (Signed)
Sent addon to the lab 

## 2011-10-25 NOTE — Telephone Encounter (Signed)
Called and spoke to the patients mother Sharon Ortega, patient signed DPR ok to speak to mother) informed of results.  She stated ok to add iron lab and referral.

## 2011-10-25 NOTE — Telephone Encounter (Signed)
Pharmacy called requesting authorization to fill pt's oxycodone. Per pharmacy Rx refill would be early, please advise.

## 2011-12-09 ENCOUNTER — Other Ambulatory Visit: Payer: Self-pay

## 2011-12-09 DIAGNOSIS — G8929 Other chronic pain: Secondary | ICD-10-CM

## 2011-12-09 MED ORDER — OXYCODONE HCL 10 MG PO TABS: ORAL_TABLET | ORAL | Status: DC

## 2011-12-09 NOTE — Telephone Encounter (Signed)
Called the patient informed prescription requested is ready for pickup at the front desk. 

## 2011-12-09 NOTE — Telephone Encounter (Signed)
Done hardcopy to robin  

## 2011-12-31 ENCOUNTER — Encounter: Payer: Self-pay | Admitting: Gastroenterology

## 2012-01-22 ENCOUNTER — Other Ambulatory Visit: Payer: Self-pay

## 2012-01-22 DIAGNOSIS — G8929 Other chronic pain: Secondary | ICD-10-CM

## 2012-01-22 MED ORDER — OXYCODONE HCL 10 MG PO TABS: ORAL_TABLET | ORAL | Status: DC

## 2012-01-22 MED ORDER — HYDROCODONE-ACETAMINOPHEN 10-325 MG PO TABS: 1.0000 | ORAL_TABLET | Freq: Four times a day (QID) | ORAL | Status: DC | PRN

## 2012-01-22 NOTE — Telephone Encounter (Signed)
Done hardcopy to robin  

## 2012-01-22 NOTE — Telephone Encounter (Signed)
Called the patient informed prescriptions are ready for pickup at the front desk. 

## 2012-01-31 ENCOUNTER — Telehealth: Payer: Self-pay | Admitting: Internal Medicine

## 2012-01-31 NOTE — Telephone Encounter (Signed)
Caller: Samyia/Patient; Phone: [phone]; Reason for Call: Please call pt re Rx for Hydrocodone; it is post-dated for 02/10/12 but her last refill was around 12/31/11 and she is almost out.  She was able to get Oxycodone filled.

## 2012-01-31 NOTE — Telephone Encounter (Signed)
Called and informed the patient of MD instructions on medication.

## 2012-01-31 NOTE — Telephone Encounter (Signed)
I cannot support hydrocodone use more than prescribed  Last rx was already done aug 14 and dated for sept 2 as she should have had enough to last until then \ No early refills, as this leads to narcotic overuse, dependence, and tolerance

## 2012-03-06 ENCOUNTER — Other Ambulatory Visit: Payer: Self-pay

## 2012-03-06 DIAGNOSIS — G8929 Other chronic pain: Secondary | ICD-10-CM

## 2012-03-06 NOTE — Telephone Encounter (Signed)
Too soon, oxycodone not due until oct 13

## 2012-03-16 ENCOUNTER — Telehealth: Payer: Self-pay | Admitting: Internal Medicine

## 2012-03-16 DIAGNOSIS — G8929 Other chronic pain: Secondary | ICD-10-CM

## 2012-03-16 MED ORDER — OXYCODONE HCL 10 MG PO TABS: ORAL_TABLET | ORAL | Status: DC

## 2012-03-16 NOTE — Telephone Encounter (Signed)
The pt called and is hoping to get a refill on oxycodone.  Thanks!

## 2012-03-16 NOTE — Telephone Encounter (Signed)
Patient informed to pickup hardcopy at the front desk. 

## 2012-03-16 NOTE — Telephone Encounter (Signed)
Done hardcopy to robin  

## 2012-05-01 ENCOUNTER — Other Ambulatory Visit: Payer: Self-pay

## 2012-05-01 DIAGNOSIS — G8929 Other chronic pain: Secondary | ICD-10-CM

## 2012-05-01 MED ORDER — OXYCODONE HCL 10 MG PO TABS: ORAL_TABLET | ORAL | Status: DC

## 2012-05-01 NOTE — Telephone Encounter (Signed)
Patient informed to pickup hardcopy at the front desk. 

## 2012-05-01 NOTE — Telephone Encounter (Signed)
Done hardcopy to robin  

## 2012-06-16 ENCOUNTER — Other Ambulatory Visit: Payer: Self-pay

## 2012-06-16 DIAGNOSIS — G8929 Other chronic pain: Secondary | ICD-10-CM

## 2012-06-16 MED ORDER — OXYCODONE HCL 10 MG PO TABS: ORAL_TABLET | ORAL | Status: DC

## 2012-06-16 NOTE — Telephone Encounter (Signed)
Called informed the patient to pickup hardcopy at the front desk. 

## 2012-06-16 NOTE — Telephone Encounter (Signed)
Done hardcopy to robin  

## 2012-07-23 ENCOUNTER — Other Ambulatory Visit: Payer: Self-pay | Admitting: Internal Medicine

## 2012-07-27 NOTE — Telephone Encounter (Signed)
Done hardcopy to robin  

## 2012-07-27 NOTE — Telephone Encounter (Signed)
Faxed hardcopy to pharmacy and called the patient to inform 

## 2012-08-03 ENCOUNTER — Other Ambulatory Visit: Payer: Self-pay

## 2012-08-03 DIAGNOSIS — G8929 Other chronic pain: Secondary | ICD-10-CM

## 2012-08-03 MED ORDER — OXYCODONE HCL 10 MG PO TABS: ORAL_TABLET | ORAL | Status: DC

## 2012-08-03 NOTE — Telephone Encounter (Signed)
Done hardcopy to robin  

## 2012-08-04 NOTE — Telephone Encounter (Signed)
Called the patient informed to pickup hardcopy at the front desk. 

## 2012-08-25 ENCOUNTER — Other Ambulatory Visit: Payer: Self-pay | Admitting: Internal Medicine

## 2012-09-25 ENCOUNTER — Telehealth: Payer: Self-pay | Admitting: Internal Medicine

## 2012-09-25 DIAGNOSIS — G8929 Other chronic pain: Secondary | ICD-10-CM

## 2012-09-25 MED ORDER — OXYCODONE HCL 10 MG PO TABS: ORAL_TABLET | ORAL | Status: DC

## 2012-09-25 NOTE — Telephone Encounter (Signed)
Called the patient informed hardcopy is ready for pickup at the front desk. 

## 2012-09-25 NOTE — Telephone Encounter (Signed)
Done hardcopy to robin  

## 2012-09-25 NOTE — Telephone Encounter (Signed)
Request refill on Oxycodone HCl 10 MG TABS

## 2012-10-23 ENCOUNTER — Emergency Department (INDEPENDENT_AMBULATORY_CARE_PROVIDER_SITE_OTHER)
Admission: EM | Admit: 2012-10-23 | Discharge: 2012-10-23 | Disposition: A | Discharge status: Home / Self Care | Payer: Self-pay | Source: Home / Self Care

## 2012-10-23 ENCOUNTER — Encounter (HOSPITAL_COMMUNITY): Payer: Self-pay | Admitting: Emergency Medicine

## 2012-10-23 DIAGNOSIS — K0889 Other specified disorders of teeth and supporting structures: Secondary | ICD-10-CM

## 2012-10-23 DIAGNOSIS — K089 Disorder of teeth and supporting structures, unspecified: Secondary | ICD-10-CM

## 2012-10-23 DIAGNOSIS — K047 Periapical abscess without sinus: Secondary | ICD-10-CM

## 2012-10-23 MED ORDER — AMOXICILLIN 500 MG PO CAPS: 1000.0000 mg | ORAL_CAPSULE | Freq: Two times a day (BID) | ORAL | Status: DC

## 2012-10-23 MED ORDER — HYDROCODONE-ACETAMINOPHEN 10-325 MG PO TABS: 1.0000 | ORAL_TABLET | Freq: Four times a day (QID) | ORAL | Status: DC | PRN

## 2012-10-23 NOTE — ED Provider Notes (Signed)
History     CSN: [id number]  Arrival date & time 10/23/12  1315   First MD Initiated Contact with Patient 10/23/12 1402      Chief Complaint  Patient presents with  . Dental Injury    (Consider location/radiation/quality/duration/timing/severity/associated sxs/prior treatment) HPI Comments: 42 year old female presents with a severe toothache. She states she has poor dentition with several cavities and he rated T. Approximately 2-3 days ago she started having pain in the upper left teeth. The pain is radiating into the left maxilla and face. States she has no insurance and has been unable to get an appointment with a dentist.  Patient is 42 y.o. female presenting with dental injury.  Dental Injury    Past Medical History  Diagnosis Date  . Depression   . Encounter for HIV (human immunodeficiency virus) test     testing  . Renal stones 08/09/2011  . Arthritis   . Bronchitis   . Hx of blood clots   . 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 NECK PAIN, CHRONIC 02/08/2009    Qualifier: Diagnosis of  By: Alphonzo Severance MD, Loni Dolly CERVICAL RADICULOPATHY 10/25/2008    Qualifier: Diagnosis of  By: Alphonzo Severance MD, Loni Dolly ANXIETY DEPRESSION 05/31/2008    Qualifier: Diagnosis of  By: Alphonzo Severance MD, Loni Dolly TOBACCO ABUSE 01/30/2010    Qualifier: Diagnosis of  By: Abner Greenspan MD, Misty Stanley    . PAP SMEAR, ABNORMAL 05/22/2010    Qualifier: Diagnosis of  By: Alphonzo Severance MD, Loni Dolly GENITAL HERPES, HX OF 03/15/2008    Qualifier:  Diagnosis of  By: Alphonzo Severance MD, Loni Dolly Bariatric surgery status 02/16/2008    Qualifier: Diagnosis of  By: Alphonzo Severance MD, Loni Dolly     Past Surgical History  Procedure Laterality Date  . Cesarean section      x2  . Gastric bypass    . 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 Cancer Maternal Grandmother     breast  . Mental retardation Maternal Grandmother   . Mental retardation Maternal Grandfather   . Cancer Paternal Grandmother     breast  . Mental retardation Paternal Grandmother   . Mental retardation Paternal Grandfather   . Arthritis Other   . Cancer Other     uterine, lung   . Hypertension Other   . Kidney disease Other     History  Substance Use Topics  . Smoking status: Current Every Day Smoker -- 1.00 packs/day    Types: Cigarettes  . Smokeless tobacco: Never Used  . Alcohol Use: No    OB History   Grav Para Term Preterm Abortions TAB SAB Ect Mult Living                  Review of Systems  Constitutional: Negative.   HENT: Negative for ear  pain, facial swelling, neck pain and neck stiffness.   All other systems reviewed and are negative.    Allergies  Review of patient's allergies indicates no known allergies.  Home Medications   Current Outpatient Rx  Name  Route  Sig  Dispense  Refill  . amoxicillin (AMOXIL) 500 MG capsule   Oral   Take 2 capsules (1,000 mg total) by mouth 2 (two) times daily.   32 capsule   0   . EXPIRED: FLUoxetine (PROZAC) 20 MG capsule   Oral   Take 1 capsule (20 mg total) by mouth daily.   90 capsule   3   . FLUoxetine (PROZAC) 20 MG capsule      TAKE 1 CAPSULE EVERY DAY   90 capsule   0   . HYDROcodone-acetaminophen (NORCO) 10-325 MG per tablet      TAKE 1 TABLET EVERY 6 HOURS AS NEEDED FOR PAIN   120 tablet   5   . HYDROcodone-acetaminophen (NORCO) 10-325 MG per tablet   Oral   Take 1 tablet by mouth every 6 (six) hours as needed  for pain.   20 tablet   0   . omeprazole (PRILOSEC OTC) 20 MG tablet   Oral   Take 1 tablet (20 mg total) by mouth 2 (two) times daily.   60 tablet   5   . Oxycodone HCl 10 MG TABS      1 tab every 4-6 hours as needed for breakthrough pain, not to exceed 2 tabs per day   100 each   0   . EXPIRED: sucralfate (CARAFATE) 1 GM/10ML suspension   Oral   Take 10 mLs (1 g total) by mouth 4 (four) times daily.   420 mL   0     BP 114/77  Pulse 90  Temp(Src) 98.7 F (37.1 C) (Oral)  Resp 22  SpO2 98%  LMP 10/08/2012  Physical Exam  Nursing note and vitals reviewed. Constitutional: She is oriented to person, place, and time. She appears well-developed and well-nourished. No distress.  HENT:  Mouth/Throat: No oropharyngeal exudate.  The upper left anterior molars have either fractures and erosions. There is surrounding erythema and puffiness of the gingiva. The left buccal mucosa more posteriorly as some white and red lesions of some suspicion.  Neck: Normal range of motion. Neck supple.  Pulmonary/Chest: Effort normal.  Lymphadenopathy:    She has no cervical adenopathy.  Neurological: She is alert and oriented to person, place, and time. She exhibits normal muscle tone.  Skin: Skin is warm and dry.  Psychiatric: She has a normal mood and affect.    ED Course  Procedures (including critical care time)  Labs Reviewed - No data to display No results found.   1. Pain, dental   2. Dental abscess       MDM  Norco 10mg   q 4h prn pain Amoxicillin 1 gm bid for 8 d See dentist asap. In addition to the erosions, cavitis and infection there are lesions to the left buccal mucosa of some suspicion. Be sure you see the dentist for these as well. Recheck if worse if fever, new symtoms        Hayden Rasmussen, NP 10/23/12 1432

## 2012-10-23 NOTE — ED Notes (Signed)
Patient reports painful tooth on left upper mouth.  Patient reports particular concern because of swelling in face and tenderness she feels is moving towards nose and eye.

## 2012-10-23 NOTE — ED Notes (Signed)
Delay in documenting triage secondary to locating appropriate dental information for patient, area clinic, mom clinic

## 2012-10-23 NOTE — ED Provider Notes (Signed)
Medical screening examination/treatment/procedure(s) were performed by resident physician or non-physician practitioner and as supervising physician I was immediately available for consultation/collaboration.   Adaliah Hiegel DOUGLAS MD.   Bhavana Kady D Abrahan Fulmore, MD 10/23/12 2044 

## 2012-11-11 ENCOUNTER — Telehealth: Payer: Self-pay | Admitting: Internal Medicine

## 2012-11-11 DIAGNOSIS — G8929 Other chronic pain: Secondary | ICD-10-CM

## 2012-11-11 MED ORDER — OXYCODONE HCL 10 MG PO TABS: ORAL_TABLET | ORAL | Status: DC

## 2012-11-11 MED ORDER — CLONAZEPAM 0.5 MG PO TABS: 0.5000 mg | ORAL_TABLET | Freq: Two times a day (BID) | ORAL | Status: DC | PRN

## 2012-11-11 NOTE — Telephone Encounter (Signed)
Done hardcopy to [facility] - pain med and klonopin bid prn

## 2012-11-11 NOTE — Telephone Encounter (Signed)
Called the patient informed hardcopy of both medications is ready for pickup at the front desk.

## 2012-12-24 ENCOUNTER — Other Ambulatory Visit: Payer: Self-pay | Admitting: Internal Medicine

## 2012-12-24 DIAGNOSIS — G8929 Other chronic pain: Secondary | ICD-10-CM

## 2012-12-24 NOTE — Telephone Encounter (Signed)
Last rx was done June 4 for #100 tabs, which at limit 2 per day should last 50 days.  She should have enough to last through a short trip for a funeral as she would not be due for refill until aug 3

## 2012-12-31 ENCOUNTER — Other Ambulatory Visit: Payer: Self-pay | Admitting: Internal Medicine

## 2012-12-31 ENCOUNTER — Telehealth: Payer: Self-pay

## 2013-01-01 ENCOUNTER — Other Ambulatory Visit: Payer: Self-pay | Admitting: Internal Medicine

## 2013-01-03 NOTE — Telephone Encounter (Signed)
Done hardcopy to robin  

## 2013-01-04 NOTE — Telephone Encounter (Signed)
Faxed hardcopy to [facility] 

## 2013-01-07 ENCOUNTER — Telehealth: Payer: Self-pay | Admitting: Internal Medicine

## 2013-01-07 DIAGNOSIS — G8929 Other chronic pain: Secondary | ICD-10-CM

## 2013-01-07 MED ORDER — OXYCODONE HCL 10 MG PO TABS: ORAL_TABLET | ORAL | Status: DC

## 2013-01-07 NOTE — Telephone Encounter (Signed)
Done hardcopy to robin  

## 2013-01-08 NOTE — Telephone Encounter (Signed)
Called the patient informed to pickup hardcopy at the front desk. 

## 2013-01-28 ENCOUNTER — Encounter: Payer: Self-pay | Admitting: Internal Medicine

## 2013-01-28 ENCOUNTER — Telehealth: Payer: Self-pay

## 2013-01-28 ENCOUNTER — Other Ambulatory Visit: Payer: Self-pay | Admitting: Internal Medicine

## 2013-01-28 NOTE — Telephone Encounter (Signed)
Very sorry, but this is office policy, we do not normally continue refills more than 15 mo after last seen

## 2013-01-28 NOTE — Telephone Encounter (Signed)
Called the patient informed of MD instructions. 

## 2013-01-28 NOTE — Telephone Encounter (Signed)
The patient does not have insurance and will have to wait until next week to make appt. As they would want the amount for OV up front in full.  She is requesting a refill on her pain medication

## 2013-01-28 NOTE — Telephone Encounter (Signed)
Pt needs OV, last seen may 2013, no further narcotic refills until seen

## 2013-01-29 ENCOUNTER — Ambulatory Visit: Payer: Self-pay | Admitting: Internal Medicine

## 2013-01-29 NOTE — Telephone Encounter (Signed)
Called the patient informed her mom of MD instructions.

## 2013-01-29 NOTE — Telephone Encounter (Signed)
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  i would be happy to help refer to pain clinic if she wants.

## 2013-02-04 ENCOUNTER — Ambulatory Visit: Payer: Self-pay | Admitting: Internal Medicine

## 2013-02-04 DIAGNOSIS — Z0289 Encounter for other administrative examinations: Secondary | ICD-10-CM

## 2013-02-05 ENCOUNTER — Other Ambulatory Visit (INDEPENDENT_AMBULATORY_CARE_PROVIDER_SITE_OTHER): Payer: Self-pay

## 2013-02-05 ENCOUNTER — Telehealth: Payer: Self-pay

## 2013-02-05 ENCOUNTER — Ambulatory Visit (INDEPENDENT_AMBULATORY_CARE_PROVIDER_SITE_OTHER): Payer: Self-pay | Admitting: Internal Medicine

## 2013-02-05 ENCOUNTER — Encounter: Payer: Self-pay | Admitting: Internal Medicine

## 2013-02-05 VITALS — BP 112/72 | HR 104 | Temp 98.6°F | Ht 65.0 in | Wt 146.2 lb

## 2013-02-05 DIAGNOSIS — G8929 Other chronic pain: Secondary | ICD-10-CM

## 2013-02-05 DIAGNOSIS — F341 Dysthymic disorder: Secondary | ICD-10-CM

## 2013-02-05 DIAGNOSIS — D509 Iron deficiency anemia, unspecified: Secondary | ICD-10-CM

## 2013-02-05 LAB — CBC WITH DIFFERENTIAL/PLATELET
HCT: 34 % — ABNORMAL LOW (ref 36.0–46.0)
Hemoglobin: 10.5 g/dL — ABNORMAL LOW (ref 12.0–15.0)
MCHC: 30.9 g/dL (ref 30.0–36.0)
MCV: 57.7 fl — ABNORMAL LOW (ref 78.0–100.0)
Platelets: 341 10*3/uL (ref 150.0–400.0)
RBC: 5.9 Mil/uL — ABNORMAL HIGH (ref 3.87–5.11)
RDW: 17.8 % — ABNORMAL HIGH (ref 11.5–14.6)
WBC: 15.6 10*3/uL — ABNORMAL HIGH (ref 4.5–10.5)

## 2013-02-05 LAB — IBC PANEL: Transferrin: 338.8 mg/dL (ref 212.0–360.0)

## 2013-02-05 MED ORDER — FLUOXETINE HCL 40 MG PO CAPS: 40.0000 mg | ORAL_CAPSULE | Freq: Every day | ORAL | Status: DC

## 2013-02-05 MED ORDER — HYDROCODONE-ACETAMINOPHEN 10-325 MG PO TABS: ORAL_TABLET | ORAL | Status: DC

## 2013-02-05 MED ORDER — OXYCODONE HCL 10 MG PO TABS: ORAL_TABLET | ORAL | Status: DC

## 2013-02-05 MED ORDER — CLONAZEPAM 0.5 MG PO TABS: 0.5000 mg | ORAL_TABLET | Freq: Two times a day (BID) | ORAL | Status: DC | PRN

## 2013-02-05 NOTE — Telephone Encounter (Signed)
Faxed hardcopy Hydrocodone 10-325 #120 to [facility] [address]  Called the patient to inform left detailed msg.

## 2013-02-05 NOTE — Patient Instructions (Signed)
OK to increase the prozac to 40 mg per day Please continue all other medications as before, and refills have been done if requested.  Please look into the application for [facility], as I think you would qualify.  Please go to the LAB in the Basement (turn left off the elevator) for the tests to be done today You will be contacted by phone if any changes need to be made immediately.  Otherwise, you will receive a letter about your results with an explanation, but please check with MyChart first.  Please return in 6 months, or sooner if needed (would need to be 6 mo due to being on the pain medication and the rules we have to follow about this, thanks)

## 2013-02-05 NOTE — Progress Notes (Signed)
Subjective:    Patient ID: Sharon Ortega, female    DOB: 1971/02/21, 42 y.o.   MRN: [id number]  HPI  Here to [place], is working but unfort found [employer] type insurance still unnffordable, Pt continues to have recurring LBP and recent worsening neck pain without  bowel or bladder change, fever, wt loss,  worsening LE pain/numbness/weakness, gait change or falls.  Had to quit stand up job at [employer], and still has bilat leg numbness to both legs at end of workday sitting.  No overt bleeding or bruising.  Has ongoing stressors and worsening depression despite the prozac 20 mg, but no suicidal ideation, or panic; has ongoing anxiety. Also no menses x 4 mo, s/p BTL Past Medical History  Diagnosis Date  . Depression   . Encounter for HIV (human immunodeficiency virus) test     testing  . Renal stones 08/09/2011  . Arthritis   . Bronchitis   . Hx of blood clots   . 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 NECK PAIN, CHRONIC 02/08/2009    Qualifier: Diagnosis of  By: Alphonzo Severance MD, Loni Dolly CERVICAL RADICULOPATHY 10/25/2008    Qualifier: Diagnosis of  By: Alphonzo Severance MD, Loni Dolly ANXIETY DEPRESSION 05/31/2008    Qualifier: Diagnosis of  By: Alphonzo Severance MD, Loni Dolly TOBACCO ABUSE 01/30/2010    Qualifier: Diagnosis of  By: Abner Greenspan MD, Misty Stanley    . PAP SMEAR, ABNORMAL 05/22/2010    Qualifier: Diagnosis of  By: Alphonzo Severance MD, Loni Dolly GENITAL HERPES, HX OF 03/15/2008    Qualifier: Diagnosis of  By: Alphonzo Severance MD, Loni Dolly Bariatric surgery  status 02/16/2008    Qualifier: Diagnosis of  By: Alphonzo Severance MD, Loni Dolly    Past Surgical History  Procedure Laterality Date  . Cesarean section      x2  . Gastric bypass    . 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  She has been smoking about 1.00 pack per day. She has never used smokeless tobacco. She reports that she uses illicit drugs (Hydrocodone). She reports that she does not drink alcohol. family history includes Arthritis in her other; Cancer in her maternal grandmother, other, and paternal grandmother; Hypertension in her other; Kidney disease in her other; Mental retardation in her maternal grandfather, maternal grandmother, paternal grandfather, and paternal grandmother. No Known Allergies Current Outpatient Prescriptions on File Prior to Visit  Medication Sig Dispense Refill  . omeprazole (PRILOSEC OTC) 20 MG tablet Take 1 tablet (20 mg total) by mouth 2 (two) times daily.  60 tablet  5  . [DISCONTINUED] albuterol (PROAIR HFA) 108 (90 BASE) MCG/ACT inhaler Inhale 2 puffs into the lungs every 6 (six) hours as needed for wheezing.  18 g  0   No current facility-administered medications on file prior to visit.   Review of Systems  Constitutional: Negative for  unexpected weight change, or unusual diaphoresis  HENT: Negative for tinnitus.   Eyes: Negative for photophobia and visual disturbance.  Respiratory: Negative for choking and stridor.   Gastrointestinal: Negative for vomiting and blood in stool.  Genitourinary: Negative for hematuria and decreased urine volume.  Musculoskeletal: Negative for acute joint swelling Skin: Negative for color change and wound.  Neurological: Negative for tremors and numbness other than noted  Psychiatric/Behavioral: Negative for decreased concentration or  hyperactivity.       Objective:   Physical Exam BP 112/72  Pulse 104  Temp(Src) 98.6 F (37 C) (Oral)  Ht 5\' 5"  (1.651 m)   Wt 146 lb 4 oz (66.339 kg)  BMI 24.34 kg/m2  SpO2 98% VS noted,  Constitutional: Pt appears well-developed and well-nourished.  HENT: Head: NCAT.  Right Ear: External ear normal.  Left Ear: External ear normal.  Eyes: Conjunctivae and EOM are normal. Pupils are equal, round, and reactive to light.  Neck: Normal range of motion. Neck supple.  Cardiovascular: Normal rate and regular rhythm.   Pulmonary/Chest: Effort normal and breath sounds normal.  Abd:  Soft, NT, non-distended, + BS Neurological: Pt is alert. Not confused  Skin: Skin is warm. No erythema.  Psychiatric: Pt behavior is normal. Thought content normal. 3+ nervous, depressed affect    Assessment & Plan:

## 2013-02-06 DIAGNOSIS — D509 Iron deficiency anemia, unspecified: Secondary | ICD-10-CM | POA: Insufficient documentation

## 2013-02-06 NOTE — Assessment & Plan Note (Signed)
For lab f/u today 

## 2013-02-06 NOTE — Assessment & Plan Note (Signed)
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  Ideally should have MRI lumbar but cannot afford at this time

## 2013-02-06 NOTE — Assessment & Plan Note (Signed)
Ok for increased prozac to 40 mg

## 2013-02-14 ENCOUNTER — Encounter: Payer: Self-pay | Admitting: Internal Medicine

## 2013-03-24 ENCOUNTER — Encounter: Payer: Self-pay | Admitting: Internal Medicine

## 2013-03-24 MED ORDER — HYDROCODONE-ACETAMINOPHEN 10-325 MG PO TABS: ORAL_TABLET | ORAL | Status: DC

## 2013-03-24 NOTE — Telephone Encounter (Signed)
Done hardcopy to robin  

## 2013-04-15 ENCOUNTER — Other Ambulatory Visit: Payer: Self-pay

## 2013-04-17 ENCOUNTER — Encounter: Payer: Self-pay | Admitting: Internal Medicine

## 2013-04-17 DIAGNOSIS — G8929 Other chronic pain: Secondary | ICD-10-CM

## 2013-04-20 MED ORDER — OXYCODONE HCL 10 MG PO TABS: ORAL_TABLET | ORAL | Status: DC

## 2013-04-20 NOTE — Telephone Encounter (Signed)
Done hardcopy to [facility]bin but also to let pt know:  You are given the letter today explaining the transitional pain medication refill policy due to recent change in [place] law  Please be aware that I will no longer be able to offer monthly refills of any Schedule II or higher medication starting Jul 11, 2013

## 2013-06-14 ENCOUNTER — Telehealth: Payer: Self-pay | Admitting: Internal Medicine

## 2013-06-15 ENCOUNTER — Encounter: Payer: Self-pay | Admitting: Internal Medicine

## 2013-06-15 MED ORDER — HYDROCODONE-ACETAMINOPHEN 10-325 MG PO TABS: ORAL_TABLET | ORAL | Status: DC

## 2013-06-16 NOTE — Telephone Encounter (Signed)
Rx ready, pt aware.

## 2013-06-28 ENCOUNTER — Other Ambulatory Visit: Payer: Self-pay | Admitting: Internal Medicine

## 2013-06-28 NOTE — Telephone Encounter (Signed)
Done hardcopy to robin  

## 2013-06-29 NOTE — Telephone Encounter (Signed)
Faxed hardcopy to [facility] [address] [place] 

## 2013-07-01 ENCOUNTER — Other Ambulatory Visit: Payer: Self-pay | Admitting: Internal Medicine

## 2013-07-01 NOTE — Telephone Encounter (Signed)
Klonopin Already done jan 19

## 2013-07-16 ENCOUNTER — Encounter (HOSPITAL_COMMUNITY): Payer: Self-pay | Admitting: Emergency Medicine

## 2013-07-16 ENCOUNTER — Emergency Department (HOSPITAL_COMMUNITY): Payer: Self-pay

## 2013-07-16 ENCOUNTER — Emergency Department (HOSPITAL_COMMUNITY)
Admission: EM | Admit: 2013-07-16 | Discharge: 2013-07-16 | Disposition: A | Discharge status: Home / Self Care | Payer: Self-pay | Attending: Emergency Medicine | Admitting: Emergency Medicine

## 2013-07-16 DIAGNOSIS — Z86718 Personal history of other venous thrombosis and embolism: Secondary | ICD-10-CM | POA: Insufficient documentation

## 2013-07-16 DIAGNOSIS — F17200 Nicotine dependence, unspecified, uncomplicated: Secondary | ICD-10-CM | POA: Insufficient documentation

## 2013-07-16 DIAGNOSIS — S52202A Unspecified fracture of shaft of left ulna, initial encounter for closed fracture: Secondary | ICD-10-CM

## 2013-07-16 DIAGNOSIS — Z79899 Other long term (current) drug therapy: Secondary | ICD-10-CM | POA: Insufficient documentation

## 2013-07-16 DIAGNOSIS — F411 Generalized anxiety disorder: Secondary | ICD-10-CM | POA: Insufficient documentation

## 2013-07-16 DIAGNOSIS — M129 Arthropathy, unspecified: Secondary | ICD-10-CM | POA: Insufficient documentation

## 2013-07-16 DIAGNOSIS — W010XXA Fall on same level from slipping, tripping and stumbling without subsequent striking against object, initial encounter: Secondary | ICD-10-CM | POA: Insufficient documentation

## 2013-07-16 DIAGNOSIS — F329 Major depressive disorder, single episode, unspecified: Secondary | ICD-10-CM | POA: Insufficient documentation

## 2013-07-16 DIAGNOSIS — Z8619 Personal history of other infectious and parasitic diseases: Secondary | ICD-10-CM | POA: Insufficient documentation

## 2013-07-16 DIAGNOSIS — W19XXXA Unspecified fall, initial encounter: Secondary | ICD-10-CM

## 2013-07-16 DIAGNOSIS — S52609A Unspecified fracture of lower end of unspecified ulna, initial encounter for closed fracture: Secondary | ICD-10-CM | POA: Insufficient documentation

## 2013-07-16 DIAGNOSIS — Y929 Unspecified place or not applicable: Secondary | ICD-10-CM | POA: Insufficient documentation

## 2013-07-16 DIAGNOSIS — Z87442 Personal history of urinary calculi: Secondary | ICD-10-CM | POA: Insufficient documentation

## 2013-07-16 DIAGNOSIS — F3289 Other specified depressive episodes: Secondary | ICD-10-CM | POA: Insufficient documentation

## 2013-07-16 DIAGNOSIS — S93409A Sprain of unspecified ligament of unspecified ankle, initial encounter: Secondary | ICD-10-CM

## 2013-07-16 DIAGNOSIS — Z9884 Bariatric surgery status: Secondary | ICD-10-CM | POA: Insufficient documentation

## 2013-07-16 DIAGNOSIS — Y93E1 Activity, personal bathing and showering: Secondary | ICD-10-CM | POA: Insufficient documentation

## 2013-07-16 DIAGNOSIS — G8929 Other chronic pain: Secondary | ICD-10-CM | POA: Insufficient documentation

## 2013-07-16 MED ORDER — HYDROCODONE-ACETAMINOPHEN 5-325 MG PO TABS
1.0000 | ORAL_TABLET | ORAL | Status: DC | PRN
Start: 2013-07-16 — End: 2013-07-22

## 2013-07-16 MED ORDER — HYDROCODONE-ACETAMINOPHEN 5-325 MG PO TABS
1.0000 | ORAL_TABLET | Freq: Once | ORAL | Status: AC
  Administered 2013-07-16: 1 via ORAL
  Filled 2013-07-16: qty 1

## 2013-07-16 NOTE — ED Notes (Signed)
Pt states she fell in the shower tonight and is now c/o pain to her left side  Pt states she has left wrist pain and left ankle pain

## 2013-07-16 NOTE — ED Provider Notes (Signed)
CSN: [id number]     Arrival date & time 07/16/13  0025 History   First MD Initiated Contact with Patient 07/16/13 0040     Chief Complaint  Patient presents with  . Fall   HPI  History provided by the patient. The patient is a 43 year old female with past history of chronic back and neck pains who presents with fall and injuries to the left arm and left ankle. Patient reports that she was showering when she slipped in the tub causing her to fall on her left forearm. She also complains of some increasing pain to her left ankle. She does have swelling in her left forearm and has increased pain with any pronation or supination movements. Denies any elbow or shoulder pain or injury. Denies any head injury or LOC. No neck or back pains. She has been ambulatory but is having increased pain to the left ankle. She did not use any treatment prior to arrival. No other aggravating or alleviating factors. No other associated symptoms.    Past Medical History  Diagnosis Date  . Depression   . Encounter for HIV (human immunodeficiency virus) test     testing  . Renal stones 08/09/2011  . Arthritis   . Bronchitis   . Hx of blood clots   . 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 NECK PAIN, CHRONIC 02/08/2009    Qualifier: Diagnosis of  By: Alphonzo Severance MD, Loni Dolly CERVICAL RADICULOPATHY 10/25/2008    Qualifier: Diagnosis of  By: Alphonzo Severance MD, Loni Dolly ANXIETY DEPRESSION 05/31/2008    Qualifier: Diagnosis of  By: Alphonzo Severance MD, Loni Dolly TOBACCO  ABUSE 01/30/2010    Qualifier: Diagnosis of  By: Abner Greenspan MD, Misty Stanley    . PAP SMEAR, ABNORMAL 05/22/2010    Qualifier: Diagnosis of  By: Alphonzo Severance MD, Loni Dolly GENITAL HERPES, HX OF 03/15/2008    Qualifier: Diagnosis of  By: Alphonzo Severance MD, Loni Dolly Bariatric surgery status 02/16/2008    Qualifier: Diagnosis of  By: Alphonzo Severance MD, Loni Dolly    Past Surgical History  Procedure Laterality Date  . Cesarean section      x2  . Gastric bypass    . 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 Cancer Maternal Grandmother     breast  . Cancer Paternal Grandmother     breast  . Arthritis Other   . Cancer Other     uterine, lung   . Hypertension Other   . Kidney disease Other    History  Substance Use Topics  . Smoking status: Current Every Day Smoker -- 1.00 packs/day    Types: Cigarettes  . Smokeless tobacco: Never Used  . Alcohol Use: No   OB History   Grav Para Term Preterm Abortions TAB SAB Ect Mult Living  Review of Systems  Neurological: Negative for weakness and numbness.  All other systems reviewed and are negative.    Allergies  Review of patient's allergies indicates no known allergies.  Home Medications   Current Outpatient Rx  Name  Route  Sig  Dispense  Refill  . FLUoxetine (PROZAC) 40 MG capsule   Oral   Take 1 capsule (40 mg total) by mouth daily.   90 capsule   3   . ibuprofen (ADVIL,MOTRIN) 200 MG tablet   Oral   Take 600-800 mg by mouth every 6 (six) hours as needed for mild pain.         . clonazePAM (KLONOPIN) 0.5 MG tablet      TAKE 1 TABLET BY MOUTH TWICE A DAY AS NEEDED FOR ANXIETY   60 tablet   5   . HYDROcodone-acetaminophen (NORCO) 10-325 MG per tablet      TAKE ONE TABLET EVERY 6 HOURS AS NEDED FOR PAIN - to fill Jun 21, 2012   120 tablet   0   . EXPIRED: omeprazole (PRILOSEC OTC) 20 MG tablet   Oral   Take 1 tablet (20 mg total) by mouth 2 (two)  times daily.   60 tablet   5   . Oxycodone HCl 10 MG TABS      1 tab every 4-6 hours as needed for breakthrough pain, not to exceed 2 tabs per day  To fill Jul 04, 2013   60 each   0    BP 108/68  Pulse 98  Temp(Src) 98.2 F (36.8 C) (Oral)  Resp 20  SpO2 100%  LMP 06/27/2013 Physical Exam  Nursing note and vitals reviewed. Constitutional: She is oriented to person, place, and time. She appears well-developed and well-nourished. No distress.  HENT:  Head: Normocephalic.  Cardiovascular: Normal rate and regular rhythm.   Pulmonary/Chest: Effort normal and breath sounds normal. No respiratory distress. She has no wheezes.  Abdominal: Soft.  Musculoskeletal:       Cervical back: Normal.       Thoracic back: Normal.       Lumbar back: Normal.  Patient has increased pain difficulty with supination and pronation of the left forearm and hand. Normal range of motion at the wrist. There is slight deformity to the mid distal ulna area with step-off. Normal pulses in the wrist. Normal sensation to the fingers and capillary refill less than 2 seconds.  Mild pain swelling to the left lateral malleolus area. There is no gross deformity. Normal dorsal pedal pulses. Normal sensation and movement of the toes.  Neurological: She is alert and oriented to person, place, and time.  Skin: Skin is warm and dry. No rash noted.  Psychiatric: She has a normal mood and affect. Her behavior is normal.    ED Course  Procedures   DIAGNOSTIC STUDIES: Oxygen Saturation is 100% room air.    COORDINATION OF CARE:  Nursing notes reviewed. Vital signs reviewed. Initial pt interview and examination performed.   12:46 AM-patient seen and evaluated. She appears in some discomfort in no acute distress. Discussed work up plan with pt at bedside, which includes x-rays of forearm and ankle. Pt agrees with plan.  X-rays reviewed. Fracture of the ulna. Will place and sugar tong. Orthopedic hand followup  provided. Patient agrees with plan.   Treatment plan initiated: Medications  HYDROcodone-acetaminophen (NORCO/VICODIN) 5-325 MG per tablet 1 tablet (not administered)      Imaging Review Dg Forearm Left  07/16/2013  CLINICAL DATA:  Status post fall.  Left forearm pain.  EXAM: LEFT FOREARM - 2 VIEW  COMPARISON:  None.  FINDINGS: The patient has an acute, nondisplaced fracture of the distal diaphysis of the left ulna. No other bony or joint abnormality is identified.  IMPRESSION: Nondisplaced distal diaphyseal fracture left ulna.   Electronically Signed   By: Drusilla Kannerhomas  Dalessio M.D.   On: 07/16/2013 01:06   Dg Ankle Complete Left  07/16/2013   CLINICAL DATA:  Fall, left ankle pain.  EXAM: LEFT ANKLE COMPLETE - 3+ VIEW  COMPARISON:  None.  FINDINGS: There is no acute bony or joint abnormality. Small plantar calcaneal spur is noted.  IMPRESSION: Negative exam.   Electronically Signed   By: Drusilla Kannerhomas  Dalessio M.D.   On: 07/16/2013 01:07      MDM   1. Fracture of ulna, left, closed   2. Fall   3. Ankle sprain        Angus Sellereter S Trenise Turay, PA-C 07/16/13 [id number]

## 2013-07-16 NOTE — Discharge Instructions (Signed)
Your x-rays today showed a fracture/break through your ulna bone.  You have been placed in a splint to help rest your injury. Please followup with an orthopedic hand specialist for continued evaluation and treatment. Use rest, ice, compression and elevation to reduce pain and swelling.    Ulnar Fracture You have a fracture (broken bone) of the forearm. This is the part of your arm between the elbow and your wrist. Your forearm is made up of two bones. These are the radius and ulna. Your fracture is in the ulna. This is the bone in your forearm located on the little finger side of your forearm. A cast or splint is used to protect and keep your injured bone from moving. The cast or splint will be on generally for about 5 to 6 weeks, with individual variations. HOME CARE INSTRUCTIONS   Keep the injured part elevated while sitting or lying down. Keep the injury above the level of your heart (the center of the chest). This will decrease swelling and pain.  Apply ice to the injury for 15-20 minutes, 03-04 times per day while awake, for 2 days. Put the ice in a plastic bag and place a towel between the bag of ice and your cast or splint.  Move your fingers to avoid stiffness and minimize swelling.  If you have a plaster or fiberglass cast:  Do not try to scratch the skin under the cast using sharp or pointed objects.  Check the skin around the cast every day. You may put lotion on any red or sore areas.  Keep your cast dry and clean.  If you have a plaster splint:  Wear the splint as directed.  You may loosen the elastic around the splint if your fingers become numb, tingle, or turn cold or blue.  Do not put pressure on any part of your cast or splint. It may break. Rest your cast only on a pillow the first 24 hours until it is fully hardened.  Your cast or splint can be protected during bathing with a plastic bag. Do not lower the cast or splint into water.  Only take over-the-counter or  prescription medicines for pain, discomfort, or fever as directed by your caregiver. SEEK IMMEDIATE MEDICAL CARE IF:   Your cast gets damaged or breaks.  You have more severe pain or swelling than you did before the cast.  You have severe pain when stretching your fingers.  There is a bad smell or new stains and/or purulent (pus like) drainage coming from under the cast. Document Released: 11/07/2005 Document Revised: 08/19/2011 Document Reviewed: 04/11/2007 [facility] Patient Information 2014 [employer] [place].    Ankle Sprain An ankle sprain is an injury to the strong, fibrous tissues (ligaments) that hold the bones of your ankle joint together.  CAUSES An ankle sprain is usually caused by a fall or by twisting your ankle. Ankle sprains most commonly occur when you step on the outer edge of your foot, and your ankle turns inward. People who participate in sports are more prone to these types of injuries.  SYMPTOMS   Pain in your ankle. The pain may be present at rest or only when you are trying to stand or walk.  Swelling.  Bruising. Bruising may develop immediately or within 1 to 2 days after your injury.  Difficulty standing or walking, particularly when turning corners or changing directions. DIAGNOSIS  Your caregiver will ask you details about your injury and perform a physical exam of your ankle  to determine if you have an ankle sprain. During the physical exam, your caregiver will press on and apply pressure to specific areas of your foot and ankle. Your caregiver will try to move your ankle in certain ways. An X-ray exam may be done to be sure a bone was not broken or a ligament did not separate from one of the bones in your ankle (avulsion fracture).  TREATMENT  Certain types of braces can help stabilize your ankle. Your caregiver can make a recommendation for this. Your caregiver may recommend the use of medicine for pain. If your sprain is severe, your caregiver may refer you  to a surgeon who helps to restore function to parts of your skeletal system (orthopedist) or a physical therapist. HOME CARE INSTRUCTIONS   Apply ice to your injury for 1 2 days or as directed by your caregiver. Applying ice helps to reduce inflammation and pain.  Put ice in a plastic bag.  Place a towel between your skin and the bag.  Leave the ice on for 15-20 minutes at a time, every 2 hours while you are awake.  Only take over-the-counter or prescription medicines for pain, discomfort, or fever as directed by your caregiver.  Elevate your injured ankle above the level of your heart as much as possible for 2 3 days.  If your caregiver recommends crutches, use them as instructed. Gradually put weight on the affected ankle. Continue to use crutches or a cane until you can walk without feeling pain in your ankle.  If you have a plaster splint, wear the splint as directed by your caregiver. Do not rest it on anything harder than a pillow for the first 24 hours. Do not put weight on it. Do not get it wet. You may take it off to take a shower or bath.  You may have been given an elastic bandage to wear around your ankle to provide support. If the elastic bandage is too tight (you have numbness or tingling in your foot or your foot becomes cold and blue), adjust the bandage to make it comfortable.  If you have an air splint, you may blow more air into it or let air out to make it more comfortable. You may take your splint off at night and before taking a shower or bath. Wiggle your toes in the splint several times per day to decrease swelling. SEEK MEDICAL CARE IF:   You have rapidly increasing bruising or swelling.  Your toes feel extremely cold or you lose feeling in your foot.  Your pain is not relieved with medicine. SEEK IMMEDIATE MEDICAL CARE IF:  Your toes are numb or blue.  You have severe pain that is increasing. MAKE SURE YOU:   Understand these instructions.  Will watch  your condition.  Will get help right away if you are not doing well or get worse. Document Released: 05/27/2005 Document Revised: 02/19/2012 Document Reviewed: 06/08/2011 [facility] Patient Information 2014 [place], [place].

## 2013-07-16 NOTE — ED Provider Notes (Signed)
Medical screening examination/treatment/procedure(s) were conducted as a shared visit with non-physician practitioner(s) and myself.  I personally evaluated the patient during the encounter.  Patient presents after a fall in the bathtub striking her left forearm, acute pain and swelling to the distal forearm, normal function of the left hand, normal sensation of the left hand, normal pulses of the left hand. Imaging confirms fracture, patient otherwise stable with no other significant injuries  Clinical Impression: Closed distal ulnar fracture      Vida RollerBrian D Jezebelle Ledwell, MD 07/16/13 [phone]

## 2013-07-16 NOTE — Progress Notes (Signed)
Orthopedic Tech Progress Note Patient Details:  [place] Ortega 05/28/1971 [id number]  Ortho Devices Type of Ortho Device: Long arm splint;Arm sling   [name], [name] 07/16/2013, 1:29 AM

## 2013-07-22 ENCOUNTER — Ambulatory Visit (INDEPENDENT_AMBULATORY_CARE_PROVIDER_SITE_OTHER)
Admission: RE | Admit: 2013-07-22 | Discharge: 2013-07-22 | Disposition: A | Discharge status: Home / Self Care | Payer: Self-pay | Source: Ambulatory Visit | Attending: Internal Medicine | Admitting: Internal Medicine

## 2013-07-22 ENCOUNTER — Ambulatory Visit (INDEPENDENT_AMBULATORY_CARE_PROVIDER_SITE_OTHER): Payer: Self-pay | Admitting: Internal Medicine

## 2013-07-22 ENCOUNTER — Encounter: Payer: Self-pay | Admitting: Internal Medicine

## 2013-07-22 VITALS — BP 110/70 | HR 99 | Temp 97.4°F | Wt 152.0 lb

## 2013-07-22 DIAGNOSIS — R079 Chest pain, unspecified: Secondary | ICD-10-CM

## 2013-07-22 DIAGNOSIS — F341 Dysthymic disorder: Secondary | ICD-10-CM

## 2013-07-22 DIAGNOSIS — G894 Chronic pain syndrome: Secondary | ICD-10-CM | POA: Insufficient documentation

## 2013-07-22 MED ORDER — HYDROCODONE-ACETAMINOPHEN 10-325 MG PO TABS: 1.0000 | ORAL_TABLET | Freq: Four times a day (QID) | ORAL | Status: DC | PRN

## 2013-07-22 NOTE — Progress Notes (Signed)
Subjective:    Patient ID: Sharon Ortega, female    DOB: 10/30/1970, 43 y.o.   MRN: [id number]  HPI  Here after fall with left ulnar fx, saw ortho yesterday.  Left ankle sprain pain improving.  Today mentions severe pain to left lateral/ant rib and asks for xray. Too much swelling yesterday to cast, so .  She's not sure if she will return.  Now with new job, full benefits in 2 mo months. Pt denies other chest pain, increased sob or doe, wheezing, orthopnea, PND, increased LE swelling, palpitations, dizziness or syncope. Pt denies new neurological symptoms such as new headache, or facial or extremity weakness or numbness   Pt denies polydipsia, polyuria. Denies worsening depressive symptoms, suicidal ideation, or panic; has ongoing anxiety Past Medical History  Diagnosis Date  . Depression   . Encounter for HIV (human immunodeficiency virus) test     testing  . Renal stones 08/09/2011  . Arthritis   . Bronchitis   . Hx of blood clots   . 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 NECK PAIN, CHRONIC 02/08/2009    Qualifier: Diagnosis of  By: Alphonzo SeveranceStafford Jr MD, Loni DollyWillie R   . CERVICAL RADICULOPATHY 10/25/2008    Qualifier: Diagnosis of  By: Alphonzo SeveranceStafford Jr MD, Loni DollyWillie R   . ANXIETY DEPRESSION 05/31/2008    Qualifier: Diagnosis of  By: Alphonzo SeveranceStafford Jr MD, Loni DollyWillie R   . TOBACCO ABUSE 01/30/2010    Qualifier: Diagnosis of  By: Abner GreenspanBlyth MD, Misty StanleyStacey    . PAP SMEAR, ABNORMAL 05/22/2010    Qualifier: Diagnosis of  By: Alphonzo SeveranceStafford Jr MD, Loni DollyWillie R   . GENITAL HERPES, HX OF  03/15/2008    Qualifier: Diagnosis of  By: Alphonzo SeveranceStafford Jr MD, Loni DollyWillie R   . Bariatric surgery status 02/16/2008    Qualifier: Diagnosis of  By: Alphonzo SeveranceStafford Jr MD, Loni DollyWillie R    Past Surgical History  Procedure Laterality Date  . Cesarean section      x2  . Gastric bypass    . 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  She has been smoking about 1.00 pack per day. She has never used smokeless tobacco. She reports that she uses illicit drugs (Hydrocodone). She reports that she does not drink alcohol. family history includes Arthritis in her other; Cancer in her maternal grandmother, other, and paternal grandmother; Hypertension in her other; Kidney disease in her other. No Known Allergies Current Outpatient Prescriptions on File Prior to Visit  Medication Sig Dispense Refill  . FLUoxetine (PROZAC) 40 MG capsule Take 1 capsule (40 mg total) by mouth daily.  90 capsule  3  . ibuprofen (ADVIL,MOTRIN) 200 MG tablet Take 600-800 mg by mouth every 6 (six) hours as needed for mild pain.      Marland Kitchen. omeprazole (PRILOSEC OTC) 20 MG tablet Take 1 tablet (20 mg total) by mouth 2 (two) times daily.  60 tablet  5  . [DISCONTINUED] albuterol (PROAIR HFA) 108 (  90 BASE) MCG/ACT inhaler Inhale 2 puffs into the lungs every 6 (six) hours as needed for wheezing.  18 g  0   No current facility-administered medications on file prior to visit.   Review of Systems  Constitutional: Negative for unexpected weight change, or unusual diaphoresis  HENT: Negative for tinnitus.   Eyes: Negative for photophobia and visual disturbance.  Respiratory: Negative for choking and stridor.   Gastrointestinal: Negative for vomiting and blood in stool.  Genitourinary: Negative for hematuria and decreased urine volume.  Musculoskeletal: Negative for acute joint swelling Skin: Negative for color change and wound.  Neurological: Negative for tremors and numbness other than noted    Psychiatric/Behavioral: Negative for decreased concentration or  hyperactivity.       Objective:   Physical Exam BP 110/70  Pulse 99  Temp(Src) 97.4 F (36.3 C) (Oral)  Wt 152 lb (68.947 kg)  SpO2 99%  LMP 06/27/2013 VS noted,  Constitutional: Pt appears well-developed and well-nourished.  HENT: Head: NCAT.  Right Ear: External ear normal.  Left Ear: External ear normal.  Eyes: Conjunctivae and EOM are normal. Pupils are equal, round, and reactive to light.  Neck: Normal range of motion. Neck supple.  Cardiovascular: Normal rate and regular rhythm.   Pulmonary/Chest: Effort normal and breath sounds normal.  Abd:  Soft, NT, non-distended, +BS Marked tender about the left 7th and 8th rib area, no bruising or swelling or rash Neurological: Pt is alert. Not confused  Skin: Skin is warm. No erythema.  Psychiatric: Pt behavior is normal. Thought content normal.     Assessment & Plan:

## 2013-07-22 NOTE — Assessment & Plan Note (Signed)
Some situational worsening recent with pain as above, but overall stable,  to f/u any worsening symptoms or concerns

## 2013-07-22 NOTE — Patient Instructions (Signed)
Please continue all other medications as before, including the hydrocodone  You are given a one time prescription for the hydrocodone today due to the acute pain, but please remember, as per the letter from November 2014, I can no longer prescribed the pain medications on a regular basis.   Please have the pharmacy call with any other refills you may need.   Please go to the XRAY Department in the Basement (go straight as you get off the elevator) for the x-ray testing  You will be contacted by phone if any changes need to be made immediately.  Otherwise, you will receive a letter about your results with an explanation, but please check with MyChart first.  Please remember to sign up for MyChart if you have not done so, as this will be important to you in the future with finding out test results, communicating by private email, and scheduling acute appointments online when needed.

## 2013-07-22 NOTE — Progress Notes (Signed)
Pre-visit discussion using our clinic review tool. No additional management support is needed unless otherwise documented below in the visit note.  

## 2013-07-22 NOTE — Assessment & Plan Note (Signed)
?   Rib fx post fall - for rib/cxr, pain control as above

## 2013-07-22 NOTE — Assessment & Plan Note (Signed)
Gave hydrocodone limited rx today, but pt is informed I am not treating chronic pain further, ok for pain clinic referral

## 2013-08-07 ENCOUNTER — Encounter: Payer: Self-pay | Admitting: Internal Medicine

## 2013-08-10 MED ORDER — HYDROCODONE-ACETAMINOPHEN 10-325 MG PO TABS: 1.0000 | ORAL_TABLET | Freq: Four times a day (QID) | ORAL | Status: DC | PRN

## 2013-08-10 NOTE — Telephone Encounter (Signed)
Sharon Ortega to see above  Done hardcopy to Sharon Ortega

## 2013-08-13 ENCOUNTER — Ambulatory Visit: Payer: Self-pay | Admitting: Internal Medicine

## 2013-08-16 ENCOUNTER — Encounter: Payer: Self-pay | Admitting: Internal Medicine

## 2013-09-17 ENCOUNTER — Encounter: Payer: Self-pay | Admitting: Internal Medicine

## 2013-09-17 MED ORDER — HYDROCODONE-ACETAMINOPHEN 10-325 MG PO TABS: 1.0000 | ORAL_TABLET | Freq: Four times a day (QID) | ORAL | Status: DC | PRN

## 2013-09-17 NOTE — Telephone Encounter (Signed)
Done hardcopy to robin  

## 2013-10-05 ENCOUNTER — Encounter: Payer: Self-pay | Admitting: Internal Medicine

## 2013-10-05 MED ORDER — FLUOXETINE HCL 20 MG PO CAPS: 20.0000 mg | ORAL_CAPSULE | Freq: Two times a day (BID) | ORAL | Status: DC

## 2013-10-06 MED ORDER — FLUOXETINE HCL 20 MG PO CAPS: 20.0000 mg | ORAL_CAPSULE | Freq: Two times a day (BID) | ORAL | Status: DC

## 2013-10-06 NOTE — Addendum Note (Signed)
Addended by: Corwin LevinsJOHN, JAMES W on: 10/06/2013 08:31 AM   Modules accepted: Orders

## 2013-12-26 ENCOUNTER — Other Ambulatory Visit: Payer: Self-pay | Admitting: Internal Medicine

## 2014-06-14 ENCOUNTER — Other Ambulatory Visit (INDEPENDENT_AMBULATORY_CARE_PROVIDER_SITE_OTHER): Payer: 59

## 2014-06-14 ENCOUNTER — Encounter: Payer: Self-pay | Admitting: Internal Medicine

## 2014-06-14 ENCOUNTER — Ambulatory Visit (INDEPENDENT_AMBULATORY_CARE_PROVIDER_SITE_OTHER): Payer: 59 | Admitting: Internal Medicine

## 2014-06-14 VITALS — BP 108/80 | HR 96 | Temp 98.0°F | Ht 65.0 in | Wt 151.5 lb

## 2014-06-14 DIAGNOSIS — M79641 Pain in right hand: Secondary | ICD-10-CM

## 2014-06-14 DIAGNOSIS — G8929 Other chronic pain: Secondary | ICD-10-CM

## 2014-06-14 DIAGNOSIS — Z23 Encounter for immunization: Secondary | ICD-10-CM

## 2014-06-14 DIAGNOSIS — R062 Wheezing: Secondary | ICD-10-CM | POA: Insufficient documentation

## 2014-06-14 DIAGNOSIS — Z20828 Contact with and (suspected) exposure to other viral communicable diseases: Secondary | ICD-10-CM

## 2014-06-14 DIAGNOSIS — D509 Iron deficiency anemia, unspecified: Secondary | ICD-10-CM

## 2014-06-14 DIAGNOSIS — M79642 Pain in left hand: Secondary | ICD-10-CM

## 2014-06-14 DIAGNOSIS — Z0000 Encounter for general adult medical examination without abnormal findings: Secondary | ICD-10-CM

## 2014-06-14 DIAGNOSIS — R1013 Epigastric pain: Secondary | ICD-10-CM | POA: Insufficient documentation

## 2014-06-14 LAB — URINALYSIS, ROUTINE W REFLEX MICROSCOPIC
HGB URINE DIPSTICK: NEGATIVE
LEUKOCYTES UA: NEGATIVE
Nitrite: NEGATIVE
Specific Gravity, Urine: 1.025 (ref 1.000–1.030)
Total Protein, Urine: NEGATIVE
UROBILINOGEN UA: 2 — AB (ref 0.0–1.0)
Urine Glucose: NEGATIVE
pH: 7 (ref 5.0–8.0)

## 2014-06-14 LAB — BASIC METABOLIC PANEL
BUN: 6 mg/dL (ref 6–23)
CO2: 25 meq/L (ref 19–32)
Calcium: 9.1 mg/dL (ref 8.4–10.5)
Chloride: 106 mEq/L (ref 96–112)
Creatinine, Ser: 0.3 mg/dL — ABNORMAL LOW (ref 0.4–1.2)
GFR: 220.8 mL/min (ref >60.00)
GLUCOSE: 89 mg/dL (ref 70–99)
POTASSIUM: 4.5 meq/L (ref 3.5–5.1)
Sodium: 136 mEq/L (ref 135–145)

## 2014-06-14 LAB — LIPID PANEL
CHOLESTEROL: 176 mg/dL (ref 0–200)
HDL: 37.1 mg/dL — ABNORMAL LOW (ref >39.00)
LDL Cholesterol: 121 mg/dL — ABNORMAL HIGH (ref 0–99)
NonHDL: 138.9
Total CHOL/HDL Ratio: 5
Triglycerides: 89 mg/dL (ref 0.0–149.0)
VLDL: 17.8 mg/dL (ref 0.0–40.0)

## 2014-06-14 LAB — CBC WITH DIFFERENTIAL/PLATELET
BASOS ABS: 0 10*3/uL (ref 0.0–0.1)
Basophils Relative: 0.1 % (ref 0.0–3.0)
Eosinophils Absolute: 0.1 10*3/uL (ref 0.0–0.7)
Eosinophils Relative: 1 % (ref 0.0–5.0)
HCT: 28.9 % — ABNORMAL LOW (ref 36.0–46.0)
Hemoglobin: 8.1 g/dL — ABNORMAL LOW (ref 12.0–15.0)
LYMPHS PCT: 15.7 % (ref 12.0–46.0)
Lymphs Abs: 2.2 10*3/uL (ref 0.7–4.0)
MCHC: 28.1 g/dL — ABNORMAL LOW (ref 30.0–36.0)
MCV: 50.5 fl — ABNORMAL LOW (ref 78.0–100.0)
Monocytes Absolute: 0.6 10*3/uL (ref 0.1–1.0)
Monocytes Relative: 4.5 % (ref 3.0–12.0)
NEUTROS PCT: 78.7 % — AB (ref 43.0–77.0)
Neutro Abs: 11.1 10*3/uL — ABNORMAL HIGH (ref 1.4–7.7)
Platelets: 337 10*3/uL (ref 150.0–400.0)
RBC: 5.73 Mil/uL — ABNORMAL HIGH (ref 3.87–5.11)
RDW: 22.5 % — AB (ref 11.5–15.5)
WBC: 14.1 10*3/uL — ABNORMAL HIGH (ref 4.0–10.5)

## 2014-06-14 LAB — HEPATIC FUNCTION PANEL
ALBUMIN: 3.8 g/dL (ref 3.5–5.2)
ALT: 8 U/L (ref 0–35)
AST: 19 U/L (ref 0–37)
Alkaline Phosphatase: 139 U/L — ABNORMAL HIGH (ref 39–117)
Bilirubin, Direct: 0.1 mg/dL (ref 0.0–0.3)
Total Bilirubin: 0.3 mg/dL (ref 0.2–1.2)
Total Protein: 7.4 g/dL (ref 6.0–8.3)

## 2014-06-14 LAB — TSH: TSH: 1.04 u[IU]/mL (ref 0.35–4.50)

## 2014-06-14 MED ORDER — PREDNISONE 10 MG PO TABS: ORAL_TABLET | ORAL | Status: DC

## 2014-06-14 MED ORDER — FLUOXETINE HCL 20 MG PO CAPS: 20.0000 mg | ORAL_CAPSULE | Freq: Two times a day (BID) | ORAL | Status: DC

## 2014-06-14 MED ORDER — TRAMADOL HCL 50 MG PO TABS: 50.0000 mg | ORAL_TABLET | Freq: Three times a day (TID) | ORAL | Status: DC | PRN

## 2014-06-14 MED ORDER — PANTOPRAZOLE SODIUM 40 MG PO TBEC: 40.0000 mg | DELAYED_RELEASE_TABLET | Freq: Every day | ORAL | Status: DC

## 2014-06-14 NOTE — Progress Notes (Signed)
Pre visit review using our clinic review tool, if applicable. No additional management support is needed unless otherwise documented below in the visit note. 

## 2014-06-14 NOTE — Assessment & Plan Note (Signed)
Mild, with evidecne for URI, likely viral, for predpac asd

## 2014-06-14 NOTE — Progress Notes (Signed)
Subjective:    Patient ID: Sharon LawsVirginia Ortega, female    DOB: 06/29/1970, 44 y.o.   MRN: [id number]  HPI   Here for wellness and f/u;  Overall doing ok;  Pt denies CP, worsening SOB, DOE, orthopnea, PND, worsening LE edema, palpitations, dizziness or syncope, except incidentally with acute onset mild to mod 2-3 days ST, HA, general weakness and malaise, with prod cough greenish sputum, and mild wheeze/sob since last PM.   Pt denies neurological change such as new headache, facial or extremity weakness.  Pt denies polydipsia, polyuria, or low sugar symptoms. Pt states overall good compliance with treatment and medications, good tolerability, and has been trying to follow lower cholesterol diet.  Pt denies worsening depressive symptoms, suicidal ideation or panic. No fever, night sweats, wt loss, loss of appetite, or other constitutional symptoms.  Pt states good ability with ADL's, has low fall risk, home safety reviewed and adequate, no other significant changes in hearing or vision, and only occasionally active with exercise. Had recent URI symptoms and cough - S/p antibx from UC recent for ? Pna..  Boyfriend with Hep C - aks to be checked.  Does still have irreg periods, has low iron anemia in past, might be able to tolerate one iron per day but not tried recently.  Taking advil freq recently for MSK in chest and neck, and bilat CTS per pt, with numbness and pain to hands as well, tends to drop things,  with now GI upset, nausea, not taking  PPI recent. Has ongoing chronic pain, declines pain clinic.  Harde to sleep due to chronic pain.  Has not tried tramadol. Past Medical History  Diagnosis Date  . Depression   . Encounter for HIV (human immunodeficiency virus) test     testing  . Renal stones 08/09/2011  . Arthritis   . Bronchitis   . Hx of blood clots   . 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 NECK PAIN, CHRONIC 02/08/2009    Qualifier: Diagnosis of  By: Alphonzo SeveranceStafford Jr MD, Loni DollyWillie R   . CERVICAL RADICULOPATHY 10/25/2008    Qualifier: Diagnosis of  By: Alphonzo SeveranceStafford Jr MD, Loni DollyWillie R   . ANXIETY DEPRESSION 05/31/2008    Qualifier: Diagnosis of  By: Alphonzo SeveranceStafford Jr MD, Loni DollyWillie R   . TOBACCO ABUSE 01/30/2010    Qualifier: Diagnosis of  By: Abner GreenspanBlyth MD, Misty StanleyStacey    . PAP SMEAR, ABNORMAL 05/22/2010    Qualifier: Diagnosis of  By: Alphonzo SeveranceStafford Jr MD, Loni DollyWillie R   . GENITAL HERPES, HX OF 03/15/2008    Qualifier: Diagnosis of  By: Alphonzo SeveranceStafford Jr MD, Loni DollyWillie R   . Bariatric surgery status 02/16/2008    Qualifier: Diagnosis of  By: Alphonzo SeveranceStafford Jr MD, Loni DollyWillie R    Past Surgical History  Procedure Laterality Date  . Cesarean section      x2  . Gastric bypass    . 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  She has been smoking about 1.00 pack per day. She has never used smokeless tobacco. She reports that she uses illicit drugs (Hydrocodone). She reports that she does not drink alcohol. family history includes Arthritis in her other; Cancer in her maternal grandmother, other, and paternal grandmother; Hypertension in her other; Kidney disease in her other. No Known Allergies Current Outpatient Prescriptions on File Prior to Visit  Medication Sig Dispense Refill  . HYDROcodone-acetaminophen (NORCO) 10-325 MG per tablet Take 1 tablet by mouth every 6 (six) hours as needed. To fill Aug 21, 2013 120 tablet 0  . [DISCONTINUED] albuterol (PROAIR HFA) 108 (90 BASE) MCG/ACT inhaler Inhale 2 puffs into the lungs every 6 (six) hours as  needed for wheezing. 18 g 0   No current facility-administered medications on file prior to visit.   Review of Systems  Constitutional: Negative for increased diaphoresis, other activity, appetite or other siginficant weight change  HENT: Negative for worsening hearing loss, ear pain, facial swelling, mouth sores and neck stiffness.   Eyes: Negative for other worsening pain, redness or visual disturbance.  Respiratory: Negative for shortness of breath and wheezing.   Cardiovascular: Negative for chest pain and palpitations.  Gastrointestinal: Negative for diarrhea, blood in stool, abdominal distention or other pain Genitourinary: Negative for hematuria, flank pain or change in urine volume.  Musculoskeletal: Negative for myalgias or other joint complaints.  Skin: Negative for color change and wound.  Neurological: Negative for syncope and numbness. other than noted Hematological: Negative for adenopathy. or other swelling Psychiatric/Behavioral: Negative for hallucinations, self-injury, decreased concentration or other worsening agitation.       Objective:   Physical Exam BP 108/80 mmHg  Pulse 96  Temp(Src) 98 F (36.7 C) (Oral)  Ht  (1.651 m)  Wt 151 lb 8 oz (68.72 kg)  BMI 25.21 kg/m2  SpO2 99% VS noted, mild ill Constitutional: Pt is oriented to person, place, and time. Appears well-developed and well-nourished.  Head: Normocephalic and atraumatic.  Right Ear: External ear normal.  Left Ear: External ear normal.  Nose: Nose normal.  Mouth/Throat: Oropharynx is clear and moist.  Bilat tm's with mild erythema.  Max sinus areas non tender.  Pharynx with mild erythema, no exudate Eyes: Conjunctivae and EOM are normal. Pupils are equal, round, and reactive to light.  Neck: Normal range of motion. Neck supple. No JVD present. No tracheal deviation present.  Cardiovascular: Normal rate, regular rhythm, normal heart sounds and intact distal pulses.   Pulmonary/Chest: Effort  normal and breath sounds without rales but with mild wheezing /few rhonchi bilat Abdominal: Soft. Bowel sounds are normal. NT. No HSM  Musculoskeletal: Normal range of motion. Exhibits no edema.  Lymphadenopathy:  Has no cervical adenopathy.  Neurological: Pt is alert and oriented to person, place, and time. Pt has normal reflexes. No cranial nerve deficit. Motor grossly intact Skin: Skin is warm and dry. No rash noted.  Psychiatric:  Has 1-2 + nervous mood and affect. Behavior is normal.     Assessment & Plan:

## 2014-06-14 NOTE — Patient Instructions (Addendum)
You had the flu shot today  Please stop the advil  Please take all new medication as prescribed  - the protonix, and the tramadol, and prednisone  You will be contacted regarding the referral for: Gastroenterology, and hand surgury  Please remember to followup with your GYN for the yearly pap smear  You [facility] be [facility]  Please continue all other medications as before, and refills have been done if requested.  Please have the pharmacy call with any other refills you may need.  Please continue your efforts at being more active, low cholesterol diet, and weight control.  You are otherwise up to date with prevention measures today.  Please keep your appointments with your specialists as you may have planned  Please go to the LAB in the Basement (turn left off the elevator) for the tests to be done today, including the Hep C test  You will be contacted by phone if any changes need to be made immediately.  Otherwise, you will receive a letter about your results with an explanation, but please check with MyChart first.  Please remember to sign up for MyChart if you have not done so, as this will be important to you in the future with finding out test results, communicating by private email, and scheduling acute appointments online when needed.  Please return in 6 months, or sooner if needed

## 2014-06-15 ENCOUNTER — Other Ambulatory Visit: Payer: Self-pay | Admitting: Internal Medicine

## 2014-06-15 ENCOUNTER — Other Ambulatory Visit (INDEPENDENT_AMBULATORY_CARE_PROVIDER_SITE_OTHER): Payer: 59

## 2014-06-15 DIAGNOSIS — D649 Anemia, unspecified: Secondary | ICD-10-CM | POA: Diagnosis not present

## 2014-06-15 DIAGNOSIS — D509 Iron deficiency anemia, unspecified: Secondary | ICD-10-CM

## 2014-06-15 DIAGNOSIS — N926 Irregular menstruation, unspecified: Secondary | ICD-10-CM

## 2014-06-15 LAB — IBC PANEL
Iron: 11 ug/dL — ABNORMAL LOW (ref 42–145)
Saturation Ratios: 2 % — ABNORMAL LOW (ref 20.0–50.0)
TRANSFERRIN: 392.4 mg/dL — AB (ref 212.0–360.0)

## 2014-06-15 LAB — VITAMIN B12: VITAMIN B 12: 344 pg/mL (ref 211–911)

## 2014-06-15 LAB — HEPATITIS PANEL, ACUTE
HCV AB: NEGATIVE
Hep A IgM: NONREACTIVE
Hep B C IgM: NONREACTIVE
Hepatitis B Surface Ag: NEGATIVE

## 2014-06-16 ENCOUNTER — Encounter: Payer: Self-pay | Admitting: Internal Medicine

## 2014-06-16 DIAGNOSIS — M79641 Pain in right hand: Secondary | ICD-10-CM | POA: Insufficient documentation

## 2014-06-16 DIAGNOSIS — M79642 Pain in left hand: Secondary | ICD-10-CM

## 2014-06-16 NOTE — Assessment & Plan Note (Signed)
Also for hand surgury referral - ? CTS

## 2014-06-16 NOTE — Assessment & Plan Note (Signed)
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

## 2014-06-16 NOTE — Assessment & Plan Note (Signed)

## 2014-06-16 NOTE — Assessment & Plan Note (Signed)
Also for iron panel f/u evaluation

## 2014-06-16 NOTE — Assessment & Plan Note (Signed)
Etiology unclear, to stop advil prn, start protonix 40 qd, refer GI - ? Need EGD

## 2014-06-20 ENCOUNTER — Telehealth: Payer: Self-pay | Admitting: Obstetrics and Gynecology

## 2014-06-20 NOTE — Telephone Encounter (Signed)
Left message for patient to call back to schedule a new patient doctor referral appointment.

## 2014-06-27 ENCOUNTER — Other Ambulatory Visit: Payer: Self-pay | Admitting: Internal Medicine

## 2014-06-27 DIAGNOSIS — Z1231 Encounter for screening mammogram for malignant neoplasm of breast: Secondary | ICD-10-CM

## 2014-07-13 ENCOUNTER — Telehealth: Payer: Self-pay | Admitting: Internal Medicine

## 2014-07-13 ENCOUNTER — Encounter: Payer: Self-pay | Admitting: Internal Medicine

## 2014-07-13 MED ORDER — FLUOXETINE HCL 20 MG PO CAPS: 20.0000 mg | ORAL_CAPSULE | Freq: Two times a day (BID) | ORAL | Status: DC

## 2014-07-13 MED ORDER — CLONAZEPAM 0.5 MG PO TABS: 0.5000 mg | ORAL_TABLET | Freq: Two times a day (BID) | ORAL | Status: AC | PRN

## 2014-07-13 MED ORDER — HYDROCODONE-ACETAMINOPHEN 10-325 MG PO TABS: 1.0000 | ORAL_TABLET | Freq: Four times a day (QID) | ORAL | Status: AC | PRN

## 2014-07-13 MED ORDER — PANTOPRAZOLE SODIUM 40 MG PO TBEC: 40.0000 mg | DELAYED_RELEASE_TABLET | Freq: Every day | ORAL | Status: AC

## 2014-07-13 NOTE — Telephone Encounter (Signed)
Prozac and protonix sent to [facility]  Clonazepam  - Done hardcopy to robin

## 2014-07-13 NOTE — Telephone Encounter (Signed)
Done hardcopy to robin  

## 2014-07-14 ENCOUNTER — Encounter: Payer: Self-pay | Admitting: Internal Medicine

## 2014-07-14 ENCOUNTER — Ambulatory Visit (INDEPENDENT_AMBULATORY_CARE_PROVIDER_SITE_OTHER): Payer: 59 | Admitting: Internal Medicine

## 2014-07-14 ENCOUNTER — Telehealth: Payer: Self-pay

## 2014-07-14 ENCOUNTER — Other Ambulatory Visit: Payer: Self-pay

## 2014-07-14 VITALS — BP 110/60 | HR 76 | Ht 64.0 in | Wt 152.1 lb

## 2014-07-14 DIAGNOSIS — D509 Iron deficiency anemia, unspecified: Secondary | ICD-10-CM

## 2014-07-14 DIAGNOSIS — R93429 Abnormal radiologic findings on diagnostic imaging of unspecified kidney: Secondary | ICD-10-CM

## 2014-07-14 DIAGNOSIS — R1084 Generalized abdominal pain: Secondary | ICD-10-CM

## 2014-07-14 DIAGNOSIS — R934 Abnormal findings on diagnostic imaging of urinary organs: Secondary | ICD-10-CM

## 2014-07-14 DIAGNOSIS — R1314 Dysphagia, pharyngoesophageal phase: Secondary | ICD-10-CM

## 2014-07-14 DIAGNOSIS — R112 Nausea with vomiting, unspecified: Secondary | ICD-10-CM

## 2014-07-14 DIAGNOSIS — N289 Disorder of kidney and ureter, unspecified: Secondary | ICD-10-CM

## 2014-07-14 DIAGNOSIS — K8020 Calculus of gallbladder without cholecystitis without obstruction: Secondary | ICD-10-CM

## 2014-07-14 MED ORDER — MOVIPREP 100 G PO SOLR: 1.0000 | Freq: Once | ORAL | Status: DC

## 2014-07-14 NOTE — Telephone Encounter (Signed)
Called the patient informed Hydrocodone and clonazepam hardcopy both at the front desk ready for pickup at her convenience.

## 2014-07-14 NOTE — Patient Instructions (Signed)
You have been scheduled for an endoscopy and colonoscopy. Please follow the written instructions given to you at your visit today. Please pick up your prep at the pharmacy within the next 1-3 days. If you use inhalers (even only as needed), please bring them with you on the day of your procedure. Your physician has requested that you go to www.startemmi.com and enter the access code given to you at your visit today. This web site gives a general overview about your procedure. However, you should still follow specific instructions given to you by our office regarding your preparation for the procedure.   Resume your Iron daily.  Then make sure you are taking 65mg  of elemental iron daily

## 2014-07-14 NOTE — Telephone Encounter (Signed)
LM on vm that upon further review of her chart, Dr. Marina GoodellPerry would like her to have an ultrasound of her kidney to follow up on a lesion that has been seen in the past.  Wanted to have her check her schedule before I scheduled it.

## 2014-07-14 NOTE — Progress Notes (Signed)
HISTORY OF PRESENT ILLNESS:  Sharon Ortega is a 44 y.o. 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 Jonny Ruiz regarding multiple GI issues. The patient has a history of marginal ulcer at the gastrojejunal anastomosis diagnosed on upper endoscopy by Dr. Ovidio Kin in August 2012. Patient has had absent medical care for years due to lack of health insurance. She recently established with Dr. Oliver Barre with multiple abdominal complaints. Blood work also revealed marked iron deficiency anemia with hemoglobin of 8.1 and MCV 50.5. Iron saturation was 2%. Comprehensive metabolic panel and TSH were unremarkable. The patient reports to me a two-year history of intermittent bloating discomfort associated with significant mid abdominal pain followed by nausea and vomiting. This may occur 3 times per week or not at all for 1 month. She also reports a 6 month history of intermittent solid food dysphagia. Her preoperative weight was 320 pounds. Current weight 152 pounds (lowest weight 132). She had been a chronic smoker but quit 2 months ago. Review additional records including imaging reveals ultrasound and CT scan of the abdomen August 2012 she is noted to have cholelithiasis. Also complex cystic lesion of the right kidney for which follow-up was recommended. She has not been on post gastric bypass vitamin or iron supplementation. She was recently prescribed pantoprazole but has not started the medication. She does use NSAIDs intermittently  REVIEW OF SYSTEMS:  All non-GI ROS negative except for anxiety, depression, arthritis, back pain, depression, visual change, decreased hearing, insomnia  Past Medical History  Diagnosis Date  . Depression   . Encounter for HIV (human immunodeficiency virus) test     testing  . Renal stones 08/09/2011  . Arthritis   . Bronchitis   . Hx of blood clots   . 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 NECK PAIN, CHRONIC 02/08/2009    Qualifier: Diagnosis of  By: Alphonzo Severance MD, Loni Dolly CERVICAL RADICULOPATHY 10/25/2008    Qualifier: Diagnosis of  By: Alphonzo Severance MD, Loni Dolly ANXIETY DEPRESSION 05/31/2008    Qualifier: Diagnosis of  By: Alphonzo Severance MD, Loni Dolly TOBACCO ABUSE 01/30/2010    Qualifier: Diagnosis of  By: Abner Greenspan MD, Misty Stanley    . PAP SMEAR, ABNORMAL 05/22/2010    Qualifier: Diagnosis of  By: Alphonzo Severance MD, Loni Dolly GENITAL HERPES, HX OF 03/15/2008    Qualifier: Diagnosis of  By: Alphonzo Severance MD, Loni Dolly Bariatric surgery status 02/16/2008    Qualifier: Diagnosis of  By: Alphonzo Severance MD, Loni Dolly Iron deficiency anemia   . Cholelithiasis   . Pneumonia 2009    Past Surgical History  Procedure Laterality Date  . Cesarean section      x2  . Gastric bypass  2006  . 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 Her smoking use included Cigarettes. She has a 20 pack-year smoking history. She has never used smokeless tobacco. She reports that she uses illicit drugs (Hydrocodone). She reports that she does not drink alcohol.  family history includes Arthritis in her other; Breast cancer in her maternal grandmother and paternal grandmother; Cancer in her mother; Colon polyps in her mother; Diabetes in her father; Hypertension in her other; Kidney disease in her daughter; Ovarian cancer in her maternal grandmother. There is  no history of Colon cancer, Esophageal cancer, or Gallbladder disease.  Allergies  Allergen Reactions  . Codeine Other (See Comments)    headaches       PHYSICAL EXAMINATION: Vital signs: BP 110/60 mmHg  Pulse 76  Ht 5\' 4"  (1.626 m)  Wt 152 lb 2 oz (69.003 kg)  BMI 26.10 kg/m2  LMP 06/09/2014  Constitutional: generally well-appearing, no acute distress Psychiatric: alert and oriented x3, cooperative Eyes: extraocular movements intact, anicteric, conjunctiva pink Mouth: oral pharynx moist, no lesions Neck: supple no lymphadenopathy Cardiovascular: heart regular rate and rhythm, no murmur Lungs: clear to auscultation bilaterally Abdomen: soft, nontender, nondistended, no obvious ascites, no peritoneal signs, normal bowel sounds, no organomegaly. Prior surgical incisions well-healed Rectal: Deferred until colonoscopy Extremities: no lower extremity edema bilaterally Skin: no lesions on visible extremities Neuro: No focal deficits. Reflexes intact.   ASSESSMENT:  #1. Status post Roux-en-Y gastric bypass surgery 2006 elsewhere #2. Recurrent problems with abdominal pain, nausea, and vomiting. Possible etiologies include recurrent marginal ulcer, symptomatic cholelithiasis, or intermittent partial obstruction from surgery #3. Iron deficiency anemia #4. Intermittent solid food dysphagia #5. Chronic pain syndrome #6. Complex lesion of the right kidney noted on ultrasound 2012. Follow-up recommended but did not occur, likely due to patient's lack of health insurance  PLAN:  #1. Colonoscopy and upper endoscopy (possible esophageal dilation) to evaluate iron deficiency anemia and recurrent abdominal pain with nausea and vomiting. As well, dysphagia.The nature of the procedure, as well as the risks, benefits, and alternatives were carefully and thoroughly reviewed with the patient. Ample time for discussion and questions allowed. The patient understood, was satisfied, and agreed to  proceed. #2. Daily oral iron supplementation daily. The patient to obtain OTC iron supplement #3. Begin PPI with history of marginal ulcer and avoid NSAIDs #4. Repeat ultrasound of the right kidney to evaluate complex cystic lesion noted in 2012 #5. Reestablish with Dr. Ovidio Kinavid Newman for routine post bariatric care. As well, if no cause for recurrent abdominal pain with nausea and vomiting found, likely secondary to symptomatic cholelithiasis for which cholecystectomy may be considered. #6. Ongoing general medical care with Dr. Jonny RuizJohn

## 2014-07-20 ENCOUNTER — Ambulatory Visit: Payer: Self-pay | Admitting: Obstetrics and Gynecology

## 2014-07-27 ENCOUNTER — Encounter: Payer: Self-pay | Admitting: Internal Medicine

## 2014-07-27 ENCOUNTER — Ambulatory Visit (AMBULATORY_SURGERY_CENTER): Payer: 59 | Admitting: Internal Medicine

## 2014-07-27 ENCOUNTER — Telehealth: Payer: Self-pay

## 2014-07-27 ENCOUNTER — Other Ambulatory Visit: Payer: Self-pay

## 2014-07-27 VITALS — BP 110/76 | HR 74 | Temp 96.8°F | Resp 16 | Ht 64.0 in | Wt 152.0 lb

## 2014-07-27 DIAGNOSIS — R1013 Epigastric pain: Secondary | ICD-10-CM

## 2014-07-27 DIAGNOSIS — R1084 Generalized abdominal pain: Secondary | ICD-10-CM

## 2014-07-27 DIAGNOSIS — R1319 Other dysphagia: Secondary | ICD-10-CM

## 2014-07-27 DIAGNOSIS — D509 Iron deficiency anemia, unspecified: Secondary | ICD-10-CM

## 2014-07-27 DIAGNOSIS — R1314 Dysphagia, pharyngoesophageal phase: Secondary | ICD-10-CM | POA: Diagnosis not present

## 2014-07-27 DIAGNOSIS — R1310 Dysphagia, unspecified: Secondary | ICD-10-CM

## 2014-07-27 MED ORDER — SODIUM CHLORIDE 0.9 % IV SOLN: 500.0000 mL | INTRAVENOUS | Status: DC

## 2014-07-27 NOTE — Op Note (Signed)
 [facility] [address] [place], [id number]   COLONOSCOPY PROCEDURE REPORT  PATIENT: [name], Sharon  MR#: [id number] BIRTHDATE: 14-Aug-1970 , 43  yrs. old GENDER: female ENDOSCOPIST: Roxy CedarJohn N Aanika Defoor Jr, MD REFERRED XB:JYNWGBY:James Camelia Stelzner, M.D. PROCEDURE DATE:  07/27/2014 PROCEDURE:   Colonoscopy, diagnostic First Screening Colonoscopy - Avg.  risk and is 50 yrs.  old or older - No.  Prior Negative Screening - Now for repeat screening. N/A  History of Adenoma - Now for follow-up colonoscopy & has been > or = to 3 yrs.  N/A  Polyps Removed Today? No.  Polyps Removed Today? No.  Recommend repeat exam, <10 yrs? Yes.  Polyps Removed Today? No.  Recommend repeat exam, <10 yrs? Yes.  Inadequate prep. ASA CLASS:   Class II INDICATIONS:iron deficiency anemia and abdominal pain. MEDICATIONS: Monitored anesthesia care and Propofol 120 mg IV  DESCRIPTION OF PROCEDURE:   After the risks benefits and alternatives of the procedure were thoroughly explained, informed consent was obtained.  The digital rectal exam revealed no abnormalities of the rectum.   The LB [id number]  endoscope was introduced through the anus and advanced to the descending colon. No adverse events experienced.   Limited by poor preparation.   The quality of the prep was poor, using MoviPrep The instrument was then slowly withdrawn as the colon was fully examined.      COLON FINDINGS: There was formed stool throughout the left colon. Could not advance beyond the descending colon.  Retroflexed views revealed no abnormalities. The time to cecum=minutes 0 seconds. Withdrawal time=minutes 0 seconds.  The scope was withdrawn and the procedure completed. COMPLICATIONS: There were no immediate complications.  ENDOSCOPIC IMPRESSION: 1. There was formed stool throughout the left colon.  Could not advance beyond the descending colon  RECOMMENDATIONS: 1.  Repeat Colonoscopy at nearest convenience with more  extensive prep. Will need 2 days of clear liquids, magnesium citrate, then standard Movi prep 2.  Upper endoscopy today (please see report)  eSigned:  Roxy CedarJohn N Llewelyn Sheaffer Jr, MD 07/27/2014 2:14 PM   cc: Corwin LevinsJames W Tanairi Cypert, MD and The Patient

## 2014-07-27 NOTE — Telephone Encounter (Signed)
-----   Message from Hilarie FredricksonJohn N Perry, MD sent at 07/27/2014  2:31 PM EST ----- Regarding: Abdominal ultrasound This patient was to have had an abdominal ultrasound scheduled at the time of her office visit. I can't see that that was done. The reason his abdominal pain and evaluate previous kidney abnormality. Please arrange. Thanks

## 2014-07-27 NOTE — Patient Instructions (Signed)
Impressions/recommendations:  Colon: Incomplete colon  Endoscopy:  No abnormalities to explain either pain or anemia. Keep appointment for abdominal ultrasound. Oral iron supplementation. Dr Oliver Barre to monitor blood counts. Reestablish care with Dr. Ovidio Kin.  YOU HAD AN ENDOSCOPIC PROCEDURE TODAY AT THE [facility]: Refer to the procedure report that was given to you for any specific questions about what was found during the examination.  If the procedure report does not answer your questions, please call your gastroenterologist to clarify.  If you requested that your care partner not be given the details of your procedure findings, then the procedure report has been included in a sealed envelope for you to review at your convenience later.  YOU SHOULD EXPECT: Some feelings of bloating in the abdomen. Passage of more gas than usual.  Walking can help get rid of the air that was put into your GI tract during the procedure and reduce the bloating. If you had a lower endoscopy (such as a colonoscopy or flexible sigmoidoscopy) you may notice spotting of blood in your stool or on the toilet paper. If you underwent a bowel prep for your procedure, then you may not have a normal bowel movement for a few days.  DIET: Your first meal following the procedure should be a light meal and then it is ok to progress to your normal diet.  A half-sandwich or bowl of soup is an example of a good first meal.  Heavy or fried foods are harder to digest and may make you feel nauseous or bloated.  Likewise meals heavy in dairy and vegetables can cause extra gas to form and this can also increase the bloating.  Drink plenty of fluids but you should avoid alcoholic beverages for 24 hours.  ACTIVITY: Your care partner should take you home directly after the procedure.  You should plan to take it easy, moving slowly for the rest of the day.  You can resume normal activity the day after the procedure  however you should NOT DRIVE or use heavy machinery for 24 hours (because of the sedation medicines used during the test).    SYMPTOMS TO REPORT IMMEDIATELY: A gastroenterologist can be reached at any hour.  During normal business hours, 8:30 AM to 5:00 PM Monday through Friday, call [phone].  After hours and on weekends, please call the GI answering service at [phone] who will take a message and have the physician on call contact you.   Following lower endoscopy (colonoscopy or flexible sigmoidoscopy):  Excessive amounts of blood in the stool  Significant tenderness or worsening of abdominal pains  Swelling of the abdomen that is new, acute  Fever of 100F or higher  Following upper endoscopy (EGD)  Vomiting of blood or coffee ground material  New chest pain or pain under the shoulder blades  Painful or persistently difficult swallowing  New shortness of breath  Fever of 100F or higher  Black, tarry-looking stools  FOLLOW UP: If any biopsies were taken you will be contacted by phone or by letter within the next 1-3 weeks.  Call your gastroenterologist if you have not heard about the biopsies in 3 weeks.  Our staff will call the home number listed on your records the next business day following your procedure to check on you and address any questions or concerns that you may have at that time regarding the information given to you following your procedure. This is a courtesy call and so if there is no  answer at the home number and we have not heard from you through the emergency physician on call, we will assume that you have returned to your regular daily activities without incident.  SIGNATURES/CONFIDENTIALITY: You and/or your care partner have signed paperwork which will be entered into your electronic medical record.  These signatures attest to the fact that that the information above on your After Visit Summary has been reviewed and is understood.  Full responsibility of  the confidentiality of this discharge information lies with you and/or your care-partner.

## 2014-07-27 NOTE — Telephone Encounter (Signed)
[facility] 08/01/14@9am , pt to arrive at 8:45am. Pt to be NPO after midnight. Pt aware of appt.

## 2014-07-27 NOTE — Progress Notes (Signed)
A/ox3 pleased with MAC, report to Tracy E RN 

## 2014-07-27 NOTE — Op Note (Addendum)
[facility] [address] [place], [id number]   ENDOSCOPY PROCEDURE REPORT  PATIENT: [name], Sharon  MR#: [id number] BIRTHDATE: 09-04-1970 , 43  yrs. old GENDER: female ENDOSCOPIST: Roxy CedarJohn N Roland Lipke Jr, MD REFERRED BY:  Oliver BarreJames Damoney Julia, M.D. PROCEDURE DATE:  07/27/2014 PROCEDURE:  EGD, diagnostic ASA CLASS:     Class II INDICATIONS:  iron deficiency anemia and epigastric abdominal pain.  MEDICATIONS: Monitored anesthesia care and Propofol 60 mg IV TOPICAL ANESTHETIC: none  DESCRIPTION OF PROCEDURE: After the risks benefits and alternatives of the procedure were thoroughly explained, informed consent was obtained.  The LB [id number] endoscope was introduced through the mouth and advanced to the second portion of the duodenum , Without limitations.  The instrument was slowly withdrawn as the mucosa was fully examined.    EXAM:The esophagus was normal.  The stomach revealed evidence of prior Roux-en-Y gastric bypass with a patent anastomosis.  Staples present.  No ulcer.  Normal jejunum.  Retroflexion was not performed.     The scope was then withdrawn from the patient and the procedure completed.  COMPLICATIONS: There were no immediate complications.  ENDOSCOPIC IMPRESSION: 1. Status post Roux-en-Y gastric bypass anatomy. NO abnormalities to explain either pain or anemia  RECOMMENDATIONS: 1.  Repeat colonoscopy with more vigorous preparation required. See colonoscopy report 2.  Please keep appointment for abdominal ultrasound . If not certain of specifics, contact my nurse. 3. Oral iron supplementation 4. Dr. Oliver BarreJames Authur Cubit to monitor your blood counts. If oral iron not effective or not tolerated, then he can arrange IV iron infusions periodically 5. Reestablish care with Dr. Ovidio Kinavid Newman, bariatric surgeon. Cholelithiasis likely cause of abdominal pain issues. May need cholecystectomy.  REPEAT EXAM:  eSigned:  Roxy CedarJohn N Petronella Shuford Jr, MD 07/27/2014 2:26  PM Revised: 07/27/2014 2:26 PM   FA:OZHYQCC:James Ellin MayhewW Dakia Schifano, MD, Ovidio Kinavid Newman, MD, and The Patient

## 2014-07-28 ENCOUNTER — Telehealth: Payer: Self-pay | Admitting: *Deleted

## 2014-07-28 NOTE — Telephone Encounter (Signed)
  Follow up Call-  Call back number 07/27/2014  Post procedure Call Back phone  # [phone]  Permission to leave phone message Yes    Spoke with mother, pt still asleep Patient questions:  Do you have a fever, pain , or abdominal swelling? No. Pain Score  0 *  Have you tolerated food without any problems? Yes.    Have you been able to return to your normal activities? Yes.    Do you have any questions about your discharge instructions: Diet   No. Medications  No. Follow up visit  No.  Do you have questions or concerns about your Care? No.  Actions: * If pain score is 4 or above: No action needed, pain <4.

## 2014-08-01 ENCOUNTER — Ambulatory Visit (HOSPITAL_COMMUNITY): Admission: RE | Admit: 2014-08-01 | Payer: 59 | Source: Ambulatory Visit

## 2014-08-03 ENCOUNTER — Telehealth: Payer: Self-pay | Admitting: *Deleted

## 2014-08-03 NOTE — Telephone Encounter (Signed)
This patient was an incomplete colonoscopy 07/27/14 due to poor bowel prep. She could not schedule an appointment while in the recovery room as she did not know her schedule and stated that she would call at a later date to reschedule. She has not rescheduled at this time, could you please call this patient and see if she is able to reschedule a colonoscopy with a 2 day prep per Dr. Marina GoodellPerry.  Thank you.

## 2014-08-04 NOTE — Telephone Encounter (Signed)
Left message for pt to call back  °

## 2014-08-05 NOTE — Telephone Encounter (Signed)
Attempted to call patient again to schedule repeat colonoscopy. No answer. Left message for patient to call back and reschedule colonoscopy and previsit.

## 2014-08-09 ENCOUNTER — Other Ambulatory Visit: Payer: Self-pay | Admitting: Internal Medicine

## 2014-08-09 ENCOUNTER — Ambulatory Visit (HOSPITAL_COMMUNITY)
Admission: RE | Admit: 2014-08-09 | Discharge: 2014-08-09 | Disposition: A | Discharge status: Home / Self Care | Payer: Self-pay | Source: Ambulatory Visit | Attending: Internal Medicine | Admitting: Internal Medicine

## 2014-08-09 DIAGNOSIS — K8020 Calculus of gallbladder without cholecystitis without obstruction: Secondary | ICD-10-CM | POA: Insufficient documentation

## 2014-08-09 DIAGNOSIS — R1084 Generalized abdominal pain: Secondary | ICD-10-CM

## 2014-08-09 DIAGNOSIS — N281 Cyst of kidney, acquired: Secondary | ICD-10-CM | POA: Insufficient documentation

## 2014-08-09 NOTE — Telephone Encounter (Signed)
Pt scheduled for previsit and colon, see result note.

## 2014-08-18 ENCOUNTER — Other Ambulatory Visit: Payer: Self-pay | Admitting: Internal Medicine

## 2014-08-30 ENCOUNTER — Encounter: Payer: Self-pay | Admitting: Internal Medicine

## 2014-10-10 ENCOUNTER — Other Ambulatory Visit: Payer: Self-pay | Admitting: *Deleted

## 2014-10-10 ENCOUNTER — Encounter: Payer: Self-pay | Admitting: Internal Medicine

## 2014-10-10 MED ORDER — FLUOXETINE HCL 20 MG PO CAPS: 20.0000 mg | ORAL_CAPSULE | Freq: Two times a day (BID) | ORAL | Status: DC

## 2014-10-10 NOTE — Telephone Encounter (Signed)
See 08/09/14 imaging study.

## 2014-10-11 MED ORDER — TRAMADOL HCL 50 MG PO TABS: 50.0000 mg | ORAL_TABLET | Freq: Three times a day (TID) | ORAL | Status: DC | PRN

## 2014-10-11 NOTE — Telephone Encounter (Signed)
Done hardcopy to Cherina  

## 2014-10-11 NOTE — Telephone Encounter (Signed)
Faxed script back to [facility].../lmb 

## 2014-10-11 NOTE — Telephone Encounter (Signed)
pls advise on refill.../lmb 

## 2015-01-20 ENCOUNTER — Other Ambulatory Visit: Payer: Self-pay

## 2015-01-20 MED ORDER — TRAMADOL HCL 50 MG PO TABS: 50.0000 mg | ORAL_TABLET | Freq: Three times a day (TID) | ORAL | Status: AC | PRN

## 2015-01-20 NOTE — Telephone Encounter (Signed)
Rx faxed to pharmacy  

## 2015-01-20 NOTE — Telephone Encounter (Signed)
Done hardcopy to Dahlia  

## 2015-07-18 ENCOUNTER — Other Ambulatory Visit: Payer: Self-pay | Admitting: Internal Medicine

## 2015-09-07 ENCOUNTER — Other Ambulatory Visit: Payer: Self-pay | Admitting: Internal Medicine

## 2015-12-05 IMAGING — US US ABDOMEN COMPLETE
1 series · 14 of 25 positions shown · non-contrast
Comparison: Ultrasound 01/25/2011.  CT 01/25/2011

CLINICAL DATA: Abdominal pain.

EXAM:
ULTRASOUND ABDOMEN COMPLETE

[Series 1: us abdomen complete · 0.20mm/px · 14 of 115 slices shown]
[im 1/115]
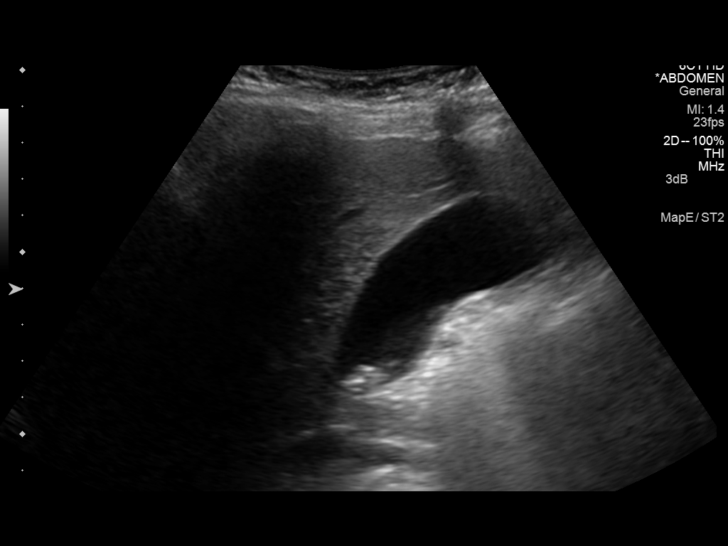
[im 10/115]
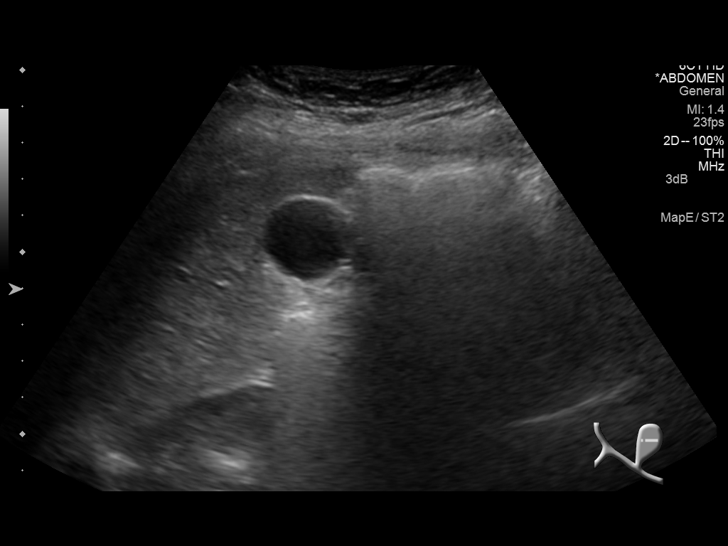
[im 20/115]
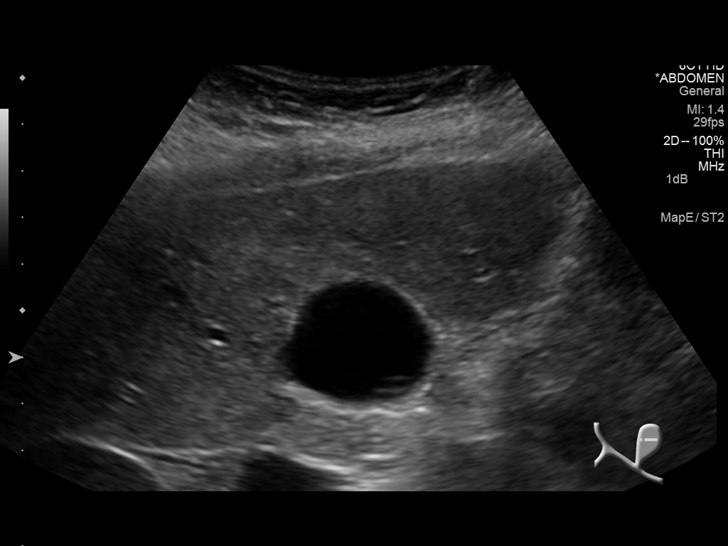
[im 29/115]
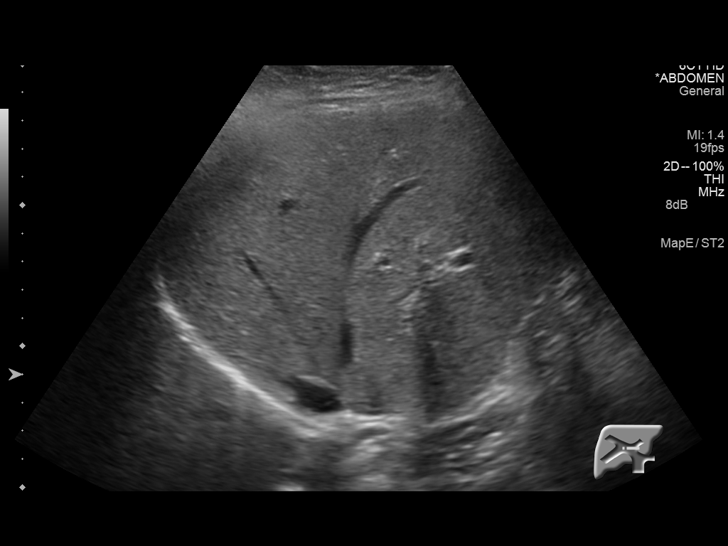
[im 39/115]
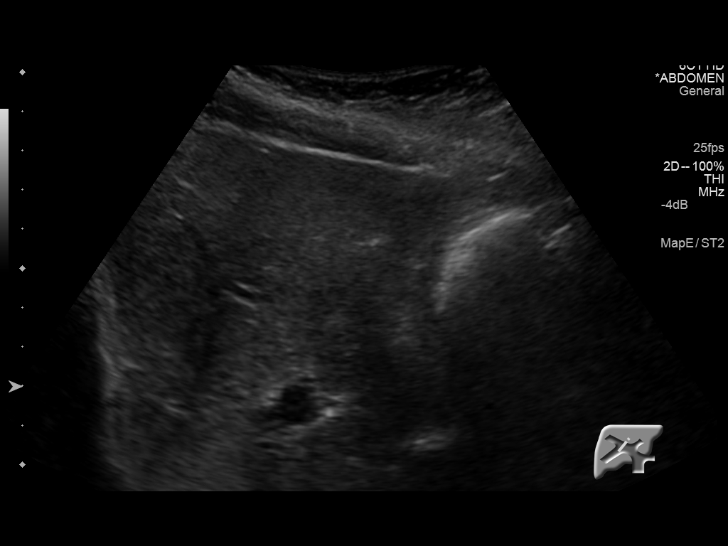
[im 43/115]
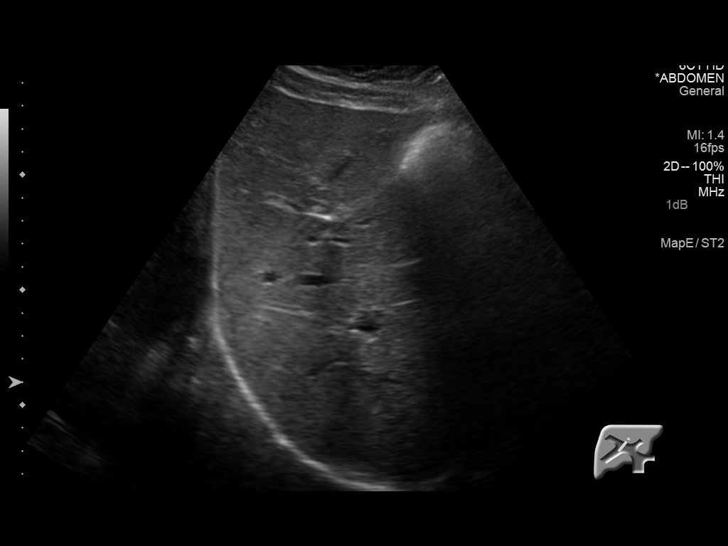
[im 53/115]
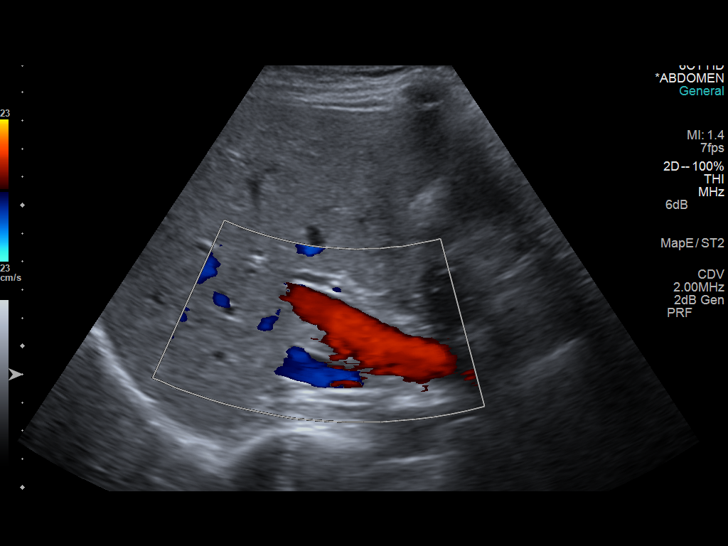
[im 62/115]
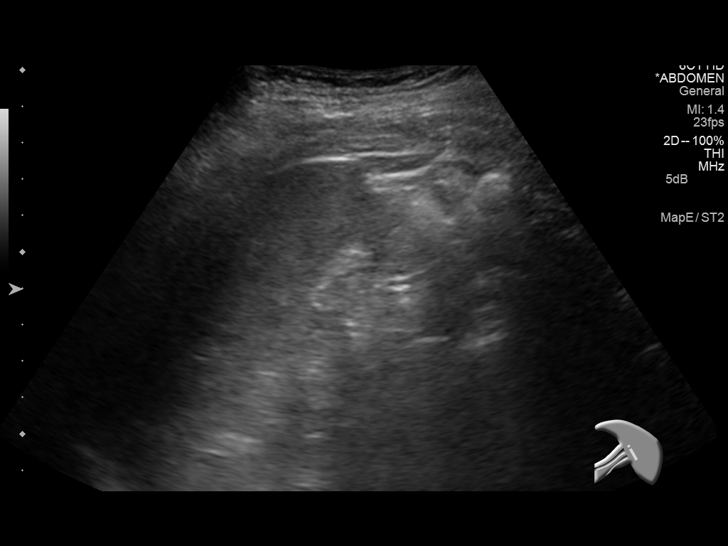
[im 72/115]
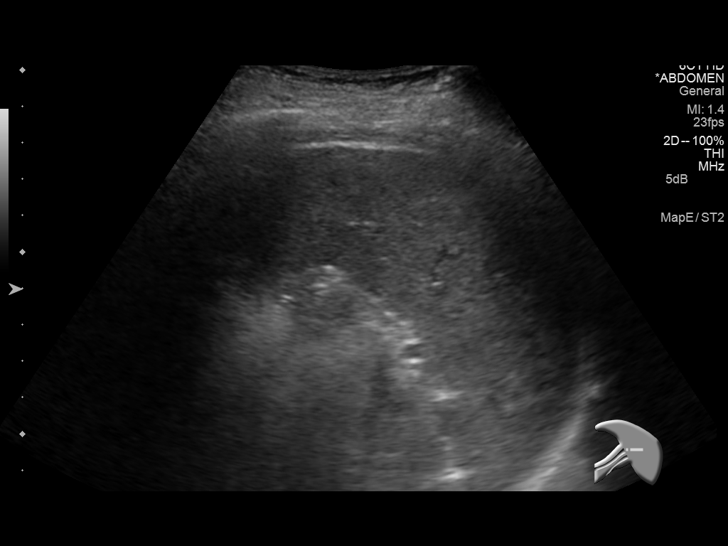
[im 77/115]
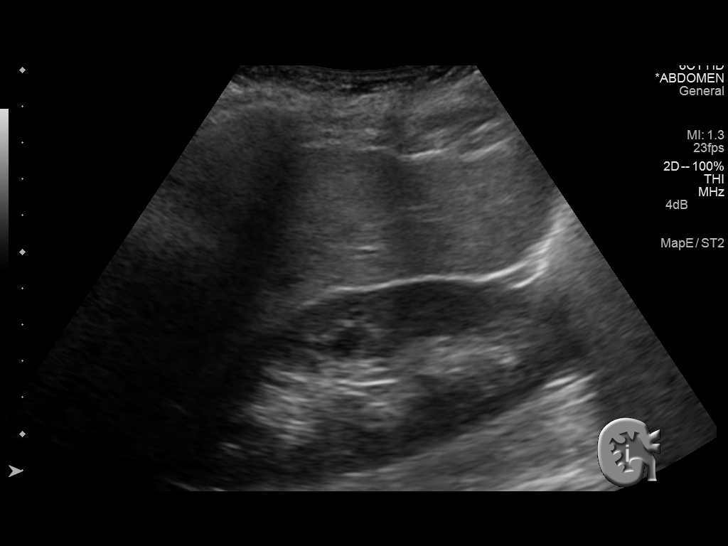
[im 86/115]
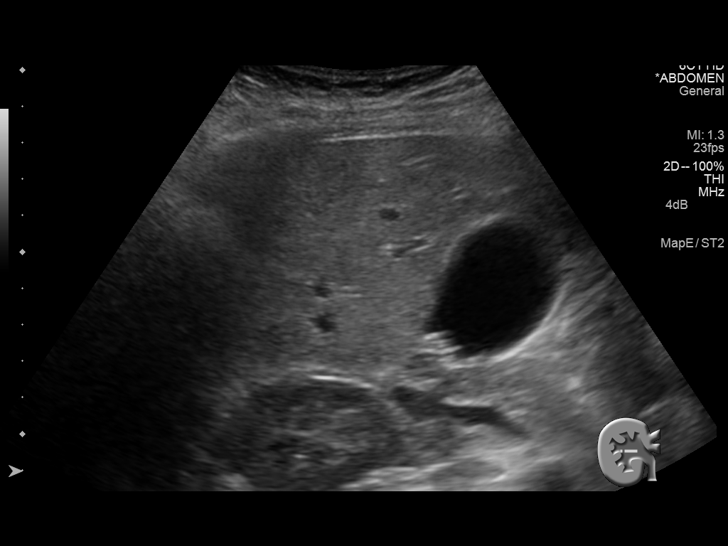
[im 96/115]
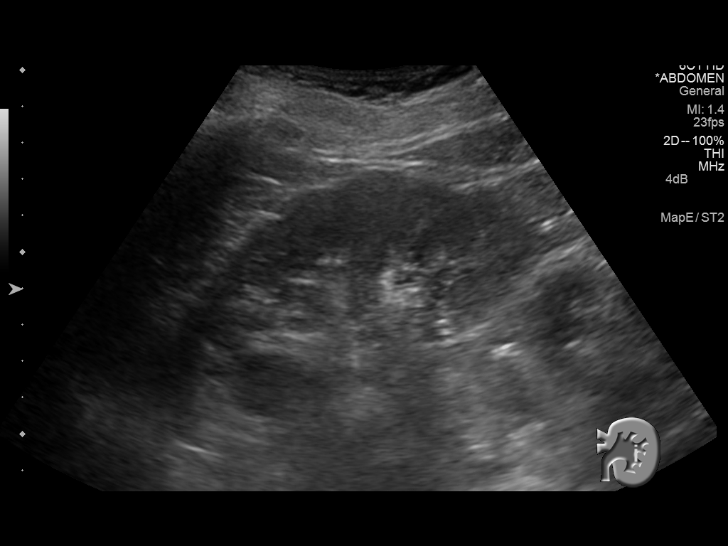
[im 105/115]
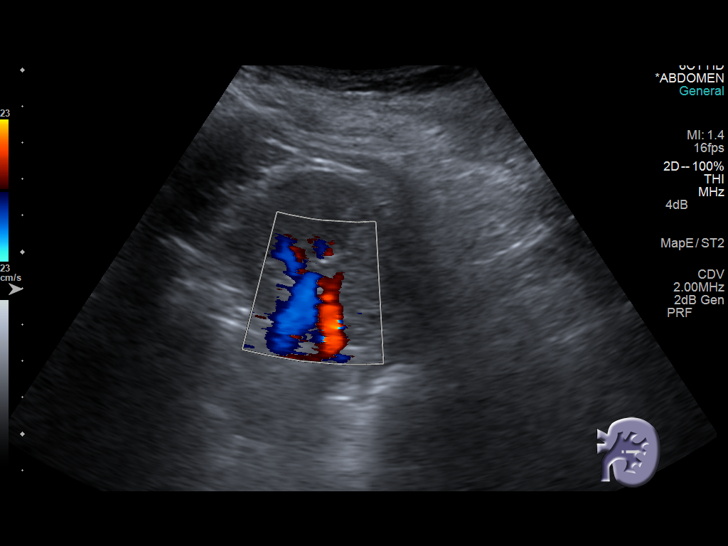
[im 115/115]
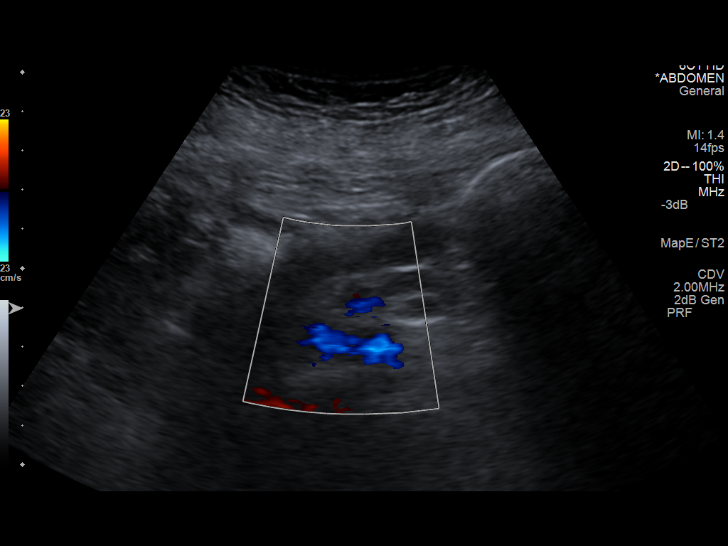

[14 of 25 positions shown; findings below may reference images not displayed]

FINDINGS: Gallbladder: Multiple mobile gallstones. Gallbladder wall thickness
2 mm. Negative Murphy sign.

Common bile duct: Diameter: 2 mm

Liver: No focal lesion identified. Within normal limits in
parenchymal echogenicity.

IVC: No abnormality visualized.

Pancreas: Visualized portion unremarkable.

Spleen: Size and appearance within normal limits. Small accessory
spleen measuring 1.6 cm noted.

Right Kidney: Length: 11.9 cm. Echogenicity within normal limits. No
hydronephrosis visualized. 1.3 cm complex cyst right kidney.

Left Kidney: Length: 10.5 cm. Echogenicity within normal limits. No
mass or hydronephrosis visualized.

Abdominal aorta: No aneurysm visualized.

Other findings: None.
IMPRESSION: 1. Multiple gallstones. No biliary obstruction. No evidence of
cholecystitis.
2. Stable small complex cyst right kidney, no change from prior
studies 2112 to
